# Patient Record
Sex: Male | Born: 1965 | Race: White | Hispanic: No | Marital: Single | Smoking: Former smoker
Health system: Southern US, Community
[De-identification: ages and names within clinical notes are randomized; demographics above are authoritative.]

## PROBLEM LIST (undated history)

## (undated) DIAGNOSIS — G4700 Insomnia, unspecified: Secondary | ICD-10-CM

## (undated) DIAGNOSIS — G4730 Sleep apnea, unspecified: Secondary | ICD-10-CM

## (undated) DIAGNOSIS — F319 Bipolar disorder, unspecified: Secondary | ICD-10-CM

## (undated) HISTORY — PX: CHOLECYSTECTOMY: SHX55

---

## 2007-09-17 ENCOUNTER — Inpatient Hospital Stay: Payer: Self-pay | Admitting: Internal Medicine

## 2007-09-28 ENCOUNTER — Emergency Department (HOSPITAL_COMMUNITY): Admission: EM | Admit: 2007-09-28 | Discharge: 2007-09-28 | Payer: Self-pay | Admitting: Emergency Medicine

## 2007-11-20 ENCOUNTER — Emergency Department: Payer: Self-pay | Admitting: Emergency Medicine

## 2007-11-20 ENCOUNTER — Other Ambulatory Visit: Payer: Self-pay

## 2008-02-11 ENCOUNTER — Inpatient Hospital Stay: Payer: Self-pay | Admitting: Unknown Physician Specialty

## 2009-01-28 ENCOUNTER — Inpatient Hospital Stay: Payer: Self-pay | Admitting: Internal Medicine

## 2009-01-31 ENCOUNTER — Inpatient Hospital Stay: Payer: Self-pay | Admitting: Psychiatry

## 2009-12-06 ENCOUNTER — Emergency Department: Payer: Self-pay | Admitting: Emergency Medicine

## 2010-01-14 ENCOUNTER — Ambulatory Visit: Payer: Self-pay | Admitting: Surgery

## 2010-01-21 ENCOUNTER — Ambulatory Visit: Payer: Self-pay | Admitting: Surgery

## 2010-03-03 ENCOUNTER — Inpatient Hospital Stay: Payer: Self-pay | Admitting: Psychiatry

## 2010-08-11 ENCOUNTER — Inpatient Hospital Stay: Payer: Self-pay | Admitting: Psychiatry

## 2011-09-02 LAB — RAPID URINE DRUG SCREEN, HOSP PERFORMED
Amphetamines: NOT DETECTED
Benzodiazepines: POSITIVE — AB
Cocaine: NOT DETECTED
Tetrahydrocannabinol: NOT DETECTED

## 2011-09-02 LAB — URINALYSIS, ROUTINE W REFLEX MICROSCOPIC
Glucose, UA: NEGATIVE
Hgb urine dipstick: NEGATIVE
Protein, ur: 30 — AB

## 2011-09-02 LAB — DIFFERENTIAL
Basophils Absolute: 0
Eosinophils Relative: 2
Lymphocytes Relative: 19
Lymphs Abs: 1.9
Monocytes Absolute: 0.6
Monocytes Relative: 6

## 2011-09-02 LAB — CBC
HCT: 47.9
Hemoglobin: 16.7
RBC: 5.46
RDW: 12.7

## 2011-09-02 LAB — BASIC METABOLIC PANEL
GFR calc Af Amer: >60
GFR calc non Af Amer: >60
Glucose, Bld: 95
Potassium: 3.6
Sodium: 142

## 2011-09-02 LAB — URINE MICROSCOPIC-ADD ON

## 2011-12-30 LAB — CBC
HCT: 46.4 % (ref 40.0–52.0)
MCH: 32.4 pg (ref 26.0–34.0)
MCHC: 34.4 g/dL (ref 32.0–36.0)
MCV: 94 fL (ref 80–100)
Platelet: 180 10*3/uL (ref 150–440)
WBC: 5.9 10*3/uL (ref 3.8–10.6)

## 2011-12-30 LAB — COMPREHENSIVE METABOLIC PANEL
Albumin: 3.6 g/dL (ref 3.4–5.0)
Anion Gap: 18 — ABNORMAL HIGH (ref 7–16)
Bilirubin,Total: 0.4 mg/dL (ref 0.2–1.0)
Calcium, Total: 8.8 mg/dL (ref 8.5–10.1)
Co2: 22 mmol/L (ref 21–32)
EGFR (Non-African Amer.): >60
Osmolality: 289 (ref 275–301)
Potassium: 2.8 mmol/L — ABNORMAL LOW (ref 3.5–5.1)
Sodium: 144 mmol/L (ref 136–145)

## 2011-12-31 ENCOUNTER — Inpatient Hospital Stay: Payer: Self-pay | Admitting: Internal Medicine

## 2011-12-31 LAB — DRUG SCREEN, URINE
Amphetamines, Ur Screen: NEGATIVE (ref ?–1000)
Cannabinoid 50 Ng, Ur ~~LOC~~: NEGATIVE (ref ?–50)
Cocaine Metabolite,Ur ~~LOC~~: NEGATIVE (ref ?–300)
MDMA (Ecstasy)Ur Screen: NEGATIVE (ref ?–500)

## 2011-12-31 LAB — URINALYSIS, COMPLETE
Bilirubin,UR: NEGATIVE
Blood: NEGATIVE
Glucose,UR: 50 mg/dL (ref 0–75)
Hyaline Cast: 20
Specific Gravity: 1.016 (ref 1.003–1.030)
Squamous Epithelial: <1

## 2011-12-31 LAB — PRO B NATRIURETIC PEPTIDE: B-Type Natriuretic Peptide: 72 pg/mL (ref 0–125)

## 2011-12-31 LAB — BASIC METABOLIC PANEL
Calcium, Total: 7.7 mg/dL — ABNORMAL LOW (ref 8.5–10.1)
Chloride: 112 mmol/L — ABNORMAL HIGH (ref 98–107)
Co2: 21 mmol/L (ref 21–32)

## 2011-12-31 LAB — SALICYLATE LEVEL: Salicylates, Serum: 2.1 mg/dL

## 2011-12-31 LAB — CK TOTAL AND CKMB (NOT AT ARMC): CK-MB: 0.9 ng/mL (ref 0.5–3.6)

## 2011-12-31 LAB — OSMOLALITY: Osmolality: 295 mOsm/kg (ref 275–295)

## 2012-01-01 LAB — BASIC METABOLIC PANEL
Calcium, Total: 7.7 mg/dL — ABNORMAL LOW (ref 8.5–10.1)
Chloride: 111 mmol/L — ABNORMAL HIGH (ref 98–107)
Co2: 22 mmol/L (ref 21–32)
EGFR (African American): >60
Potassium: 3.4 mmol/L — ABNORMAL LOW (ref 3.5–5.1)
Sodium: 144 mmol/L (ref 136–145)

## 2012-01-01 LAB — CBC WITH DIFFERENTIAL/PLATELET
Basophil %: 0.1 %
Eosinophil #: 0 10*3/uL (ref 0.0–0.7)
HCT: 37.9 % — ABNORMAL LOW (ref 40.0–52.0)
HGB: 13 g/dL (ref 13.0–18.0)
Lymphocyte %: 7.5 %
MCH: 32.2 pg (ref 26.0–34.0)
MCV: 94 fL (ref 80–100)
Neutrophil #: 10.2 10*3/uL — ABNORMAL HIGH (ref 1.4–6.5)
RBC: 4.05 10*6/uL — ABNORMAL LOW (ref 4.40–5.90)
WBC: 11.5 10*3/uL — ABNORMAL HIGH (ref 3.8–10.6)

## 2012-01-01 LAB — HEPATIC FUNCTION PANEL A (ARMC)
SGOT(AST): 16 U/L (ref 15–37)
SGPT (ALT): 11 U/L — ABNORMAL LOW
Total Protein: 4.8 g/dL — ABNORMAL LOW (ref 6.4–8.2)

## 2012-01-02 LAB — BASIC METABOLIC PANEL
BUN: 8 mg/dL (ref 7–18)
EGFR (African American): >60
EGFR (Non-African Amer.): >60
Glucose: 128 mg/dL — ABNORMAL HIGH (ref 65–99)
Osmolality: 289 (ref 275–301)
Potassium: 3.5 mmol/L (ref 3.5–5.1)
Sodium: 145 mmol/L (ref 136–145)

## 2012-01-02 LAB — CBC WITH DIFFERENTIAL/PLATELET
Basophil #: 0 10*3/uL (ref 0.0–0.1)
HCT: 37.1 % — ABNORMAL LOW (ref 40.0–52.0)
Lymphocyte %: 9.2 %
Monocyte %: 4 %
Platelet: 117 10*3/uL — ABNORMAL LOW (ref 150–440)
RDW: 14.3 % (ref 11.5–14.5)
WBC: 9.8 10*3/uL (ref 3.8–10.6)

## 2012-01-03 LAB — PHOSPHORUS: Phosphorus: 2.9 mg/dL (ref 2.5–4.9)

## 2012-01-03 LAB — CBC WITH DIFFERENTIAL/PLATELET
Basophil %: 0.3 %
Eosinophil %: 2.7 %
HGB: 12.8 g/dL — ABNORMAL LOW (ref 13.0–18.0)
Lymphocyte #: 0.9 10*3/uL — ABNORMAL LOW (ref 1.0–3.6)
MCH: 32.3 pg (ref 26.0–34.0)
MCV: 94 fL (ref 80–100)
Monocyte %: 5.1 %
Neutrophil #: 7.2 10*3/uL — ABNORMAL HIGH (ref 1.4–6.5)
RDW: 14.2 % (ref 11.5–14.5)

## 2012-01-03 LAB — BASIC METABOLIC PANEL
Anion Gap: 10 (ref 7–16)
Calcium, Total: 8.3 mg/dL — ABNORMAL LOW (ref 8.5–10.1)
Co2: 27 mmol/L (ref 21–32)
Creatinine: 0.76 mg/dL (ref 0.60–1.30)
EGFR (African American): >60

## 2012-01-03 LAB — MAGNESIUM: Magnesium: 1.9 mg/dL

## 2012-01-05 ENCOUNTER — Inpatient Hospital Stay: Payer: Self-pay | Admitting: Psychiatry

## 2012-01-05 LAB — BASIC METABOLIC PANEL
Calcium, Total: 9.1 mg/dL (ref 8.5–10.1)
Chloride: 106 mmol/L (ref 98–107)
Co2: 28 mmol/L (ref 21–32)
Glucose: 93 mg/dL (ref 65–99)
Osmolality: 287 (ref 275–301)
Potassium: 3.4 mmol/L — ABNORMAL LOW (ref 3.5–5.1)

## 2012-01-05 LAB — CBC WITH DIFFERENTIAL/PLATELET
Basophil #: 0 10*3/uL (ref 0.0–0.1)
Eosinophil #: 0.3 10*3/uL (ref 0.0–0.7)
Lymphocyte %: 17.3 %
MCH: 32.3 pg (ref 26.0–34.0)
MCHC: 34.4 g/dL (ref 32.0–36.0)
Monocyte #: 0.8 10*3/uL — ABNORMAL HIGH (ref 0.0–0.7)
Neutrophil %: 67.9 %
Platelet: 150 10*3/uL (ref 150–440)
RDW: 14 % (ref 11.5–14.5)

## 2012-01-05 LAB — FOLATE: Folic Acid: 10.5 ng/mL (ref 3.1–100.0)

## 2012-01-06 LAB — BRONCHIAL WASH CULTURE

## 2012-01-06 LAB — EXPECTORATED SPUTUM ASSESSMENT W GRAM STAIN, RFLX TO RESP C

## 2012-01-07 LAB — FOLATE: Folic Acid: 15.3 ng/mL (ref 3.1–100.0)

## 2012-01-07 LAB — LIPID PANEL
Cholesterol: 103 mg/dL (ref 0–200)
Ldl Cholesterol, Calc: 60 mg/dL (ref 0–100)
Triglycerides: 112 mg/dL (ref 0–200)
VLDL Cholesterol, Calc: 22 mg/dL (ref 5–40)

## 2013-03-19 IMAGING — CT CT HEAD WITHOUT CONTRAST
2 series · 16 of 30 positions shown, 20 images · non-contrast
Comparison: none

REASON FOR EXAM: found minimally responsive beside the road
COMMENTS:

PROCEDURE:     CT  - CT HEAD WITHOUT CONTRAST  - December 30, 2011 [DATE]
RESULT:     Comparison:  None
TECHNIQUE: Multiple axial images from the foramen magnum to the vertex were
obtained without IV contrast.

[Series 2: without · axial · non-contrast · 0.47mm/px · z∈[+443,+593]mm · 13 of 41 slices shown, 17 images]
[im 3/41  brain]
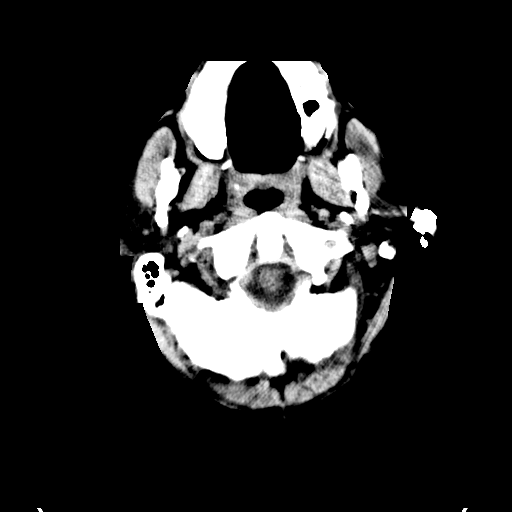
[im 3/41  bone]
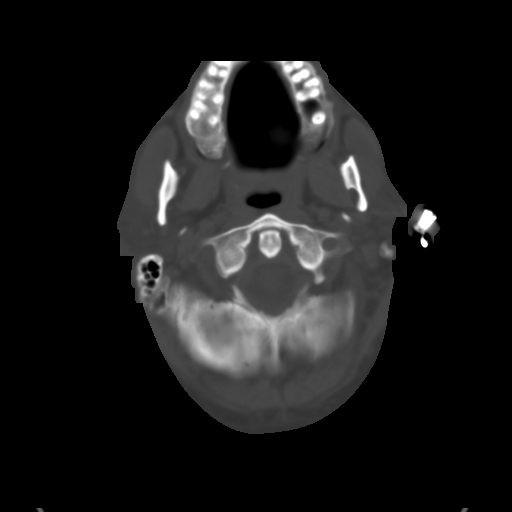
[im 6/41  brain]
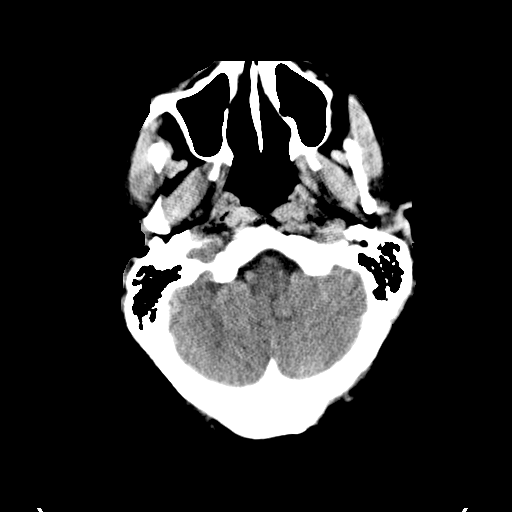
[im 9/41  brain]
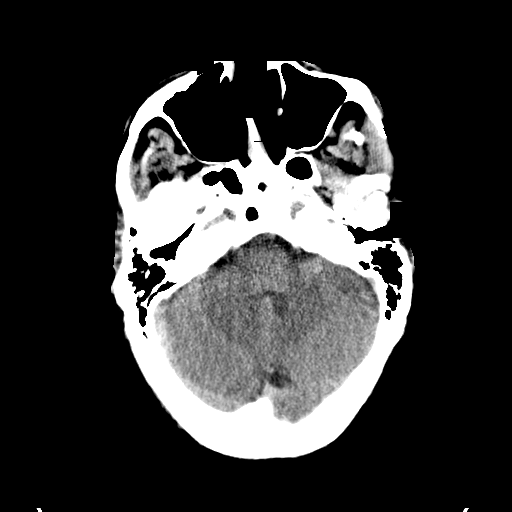
[im 12/41  brain]
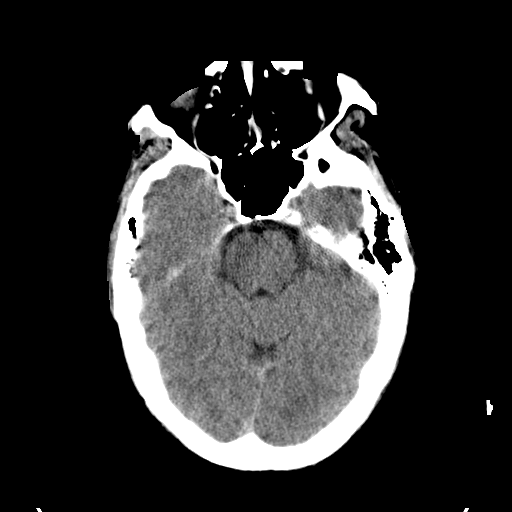
[im 15/41  brain]
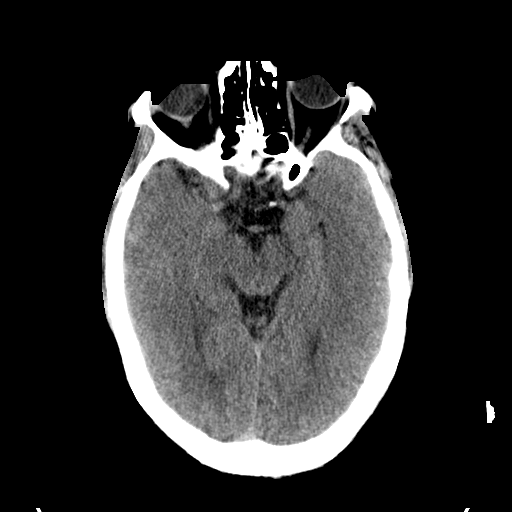
[im 15/41  bone]
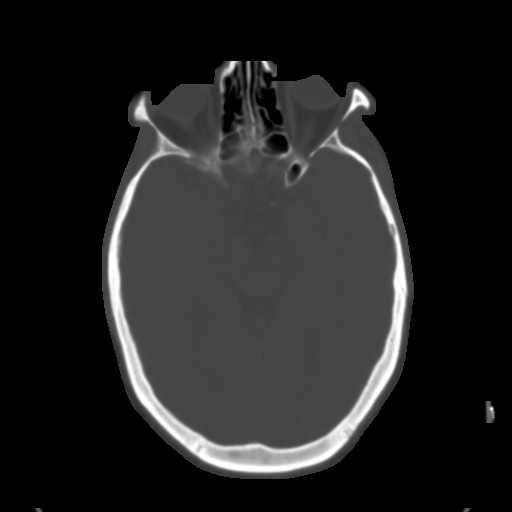
[im 18/41  brain]
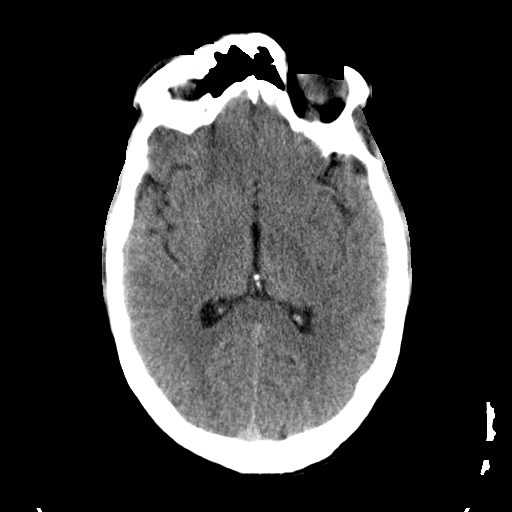
[im 21/41  brain]
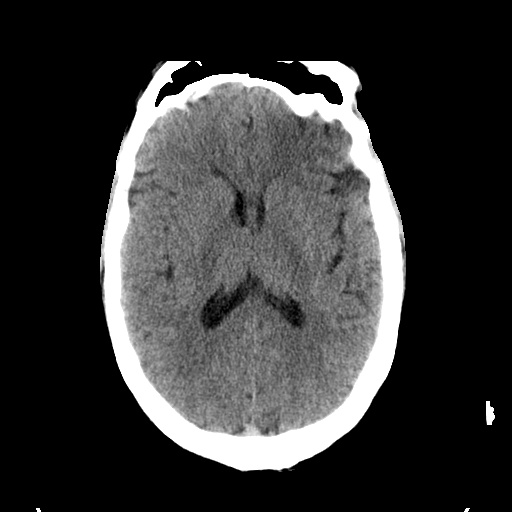
[im 23/41  brain]
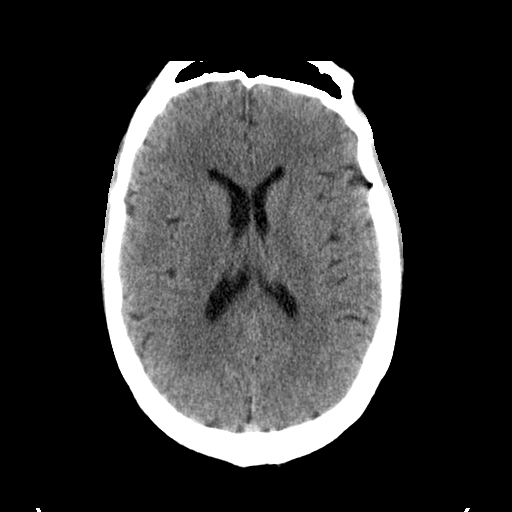
[im 26/41  brain]
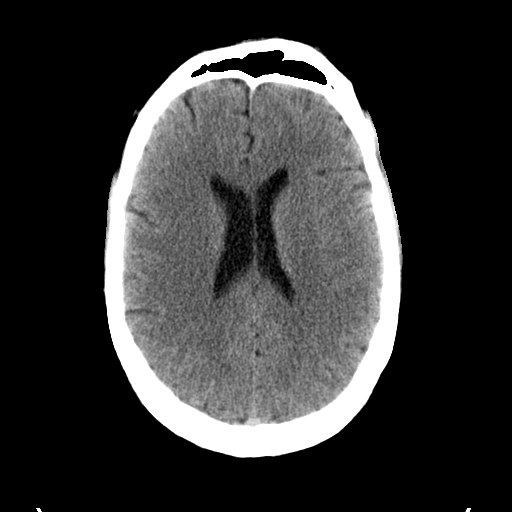
[im 26/41  bone]
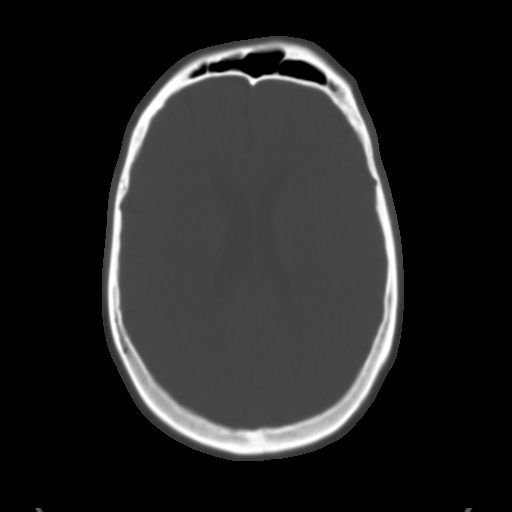
[im 29/41  brain]
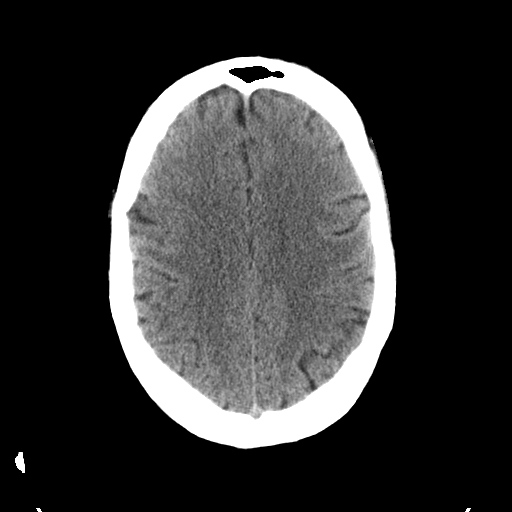
[im 32/41  brain]
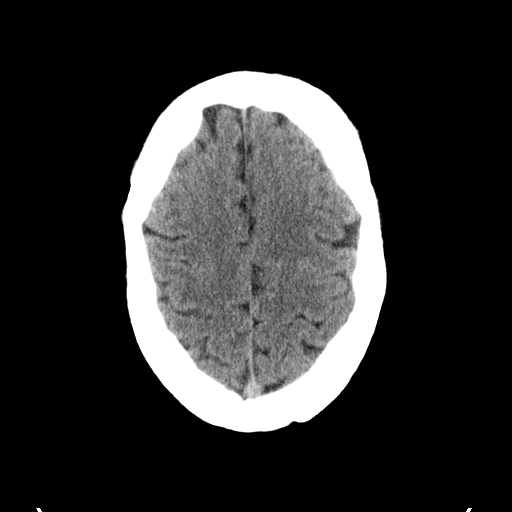
[im 35/41  brain]
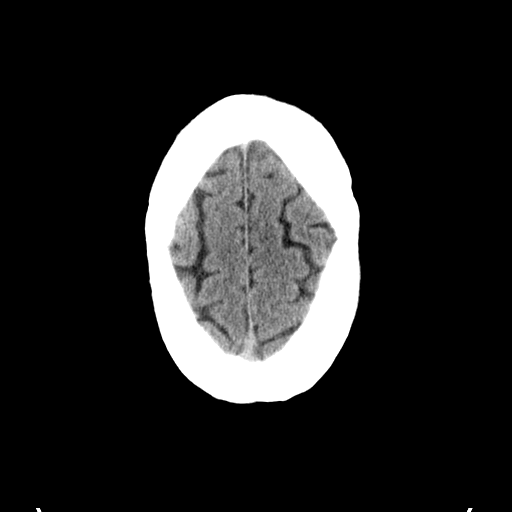
[im 38/41  brain]
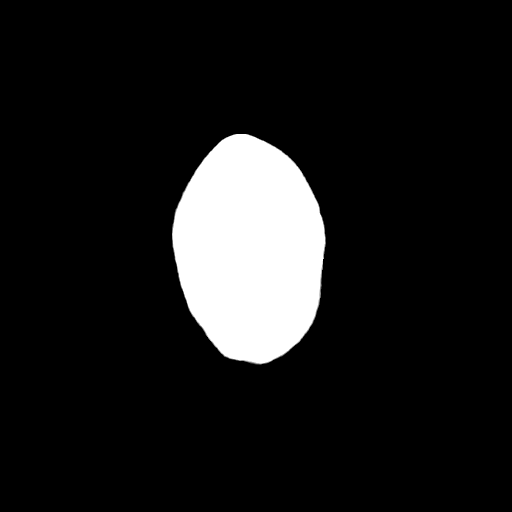
[im 38/41  bone]
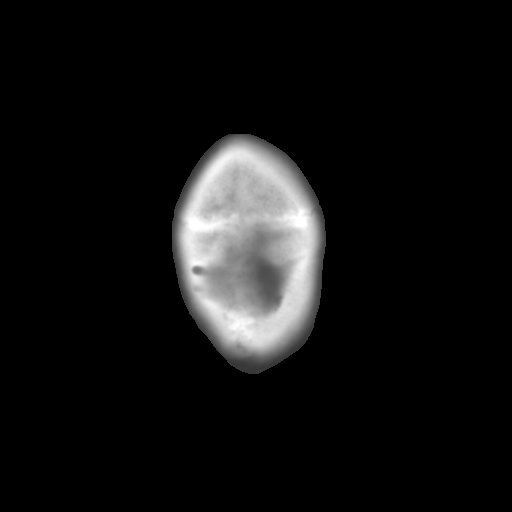

[Series 3: bone · axial · 0.47mm/px · z∈[+443,+503]mm · 3 of 41 slices shown]
[im 3/41  bone]
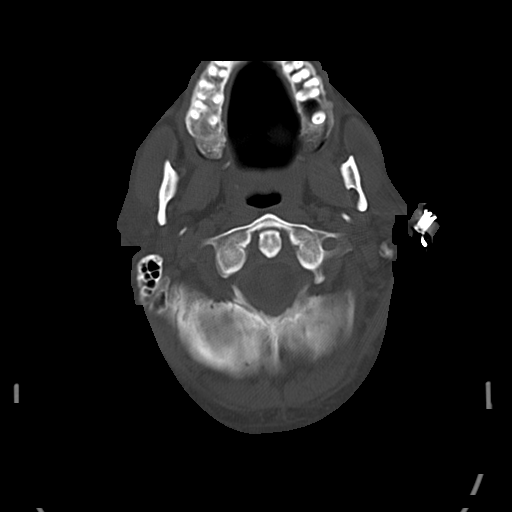
[im 9/41  bone]
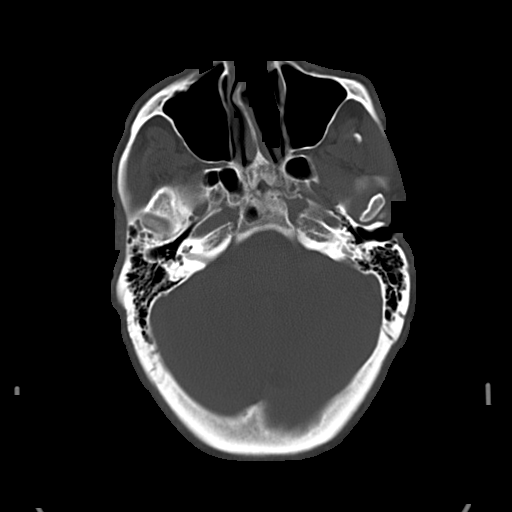
[im 15/41  bone]
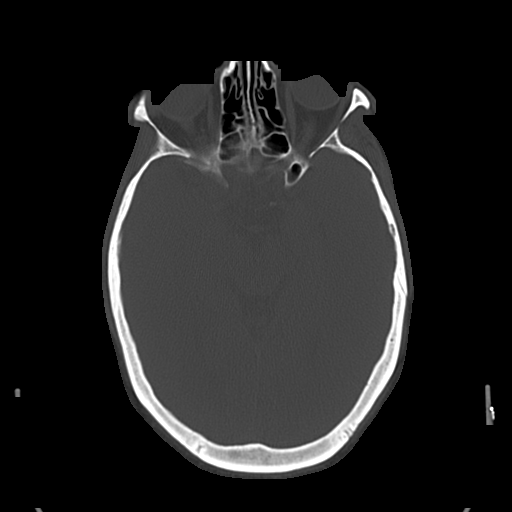

[16 of 30 positions shown; findings below may reference images not displayed]

FINDINGS: There is no evidence of mass effect, midline shift, or extra-axial fluid
collections.  There is no evidence of a space-occupying lesion or
intracranial hemorrhage. There is no evidence of a cortical-based area of
acute infarction.

The ventricles and sulci are appropriate for the patient's age. The basal
cisterns are patent.

Visualized portions of the orbits are unremarkable. The visualized portions
of the paranasal sinuses and mastoid air cells are unremarkable.

The osseous structures are unremarkable.
IMPRESSION: No acute intracranial process.

## 2014-06-03 DIAGNOSIS — F200 Paranoid schizophrenia: Secondary | ICD-10-CM | POA: Insufficient documentation

## 2015-03-18 NOTE — Discharge Summary (Signed)
PATIENT NAME:  Randy Velez, Randy Velez DATE OF BIRTH:  Feb 28, 1966  DATE OF ADMISSION:  01/05/2012 DATE OF DISCHARGE:  01/12/2012  HISTORY OF PRESENT ILLNESS: [name] is a 49 year old male with bipolar disorder and alcohol dependence who was admitted to the inpatient behavioral unit on 01/05/2012, coming off the general medical ward. He was on the general medical ward after overdosing on 25 of 300-mg tablets of Seroquel.  He initially required Critical Care Unit care due to respiratory distress. He also suffered from aspiration pneumonia.  His alcohol level in the Emergency Room was 119. He acknowledged suicidal intent regarding his overdose.   Precipitating stress had involved an argument with his boyfriend. There was concern that his boyfriend had been unfaithful. Please see the History and Physical dictation.   ANCILLARY CLINICAL DATA: The patient did receive Levaquin 500 mg daily for five days regarding the ongoing treatment of his aspiration pneumonia once he got to the inpatient behavioral health unit. His Protonix was continued for gastroesophageal reflux disease.   HOSPITAL COURSE: [name] was admitted to the inpatient behavioral unit and underwent the Ativan CIWA protocol. He was restarted on his Seroquel for anti-psychosis and mood stabilization. The Seroquel was titrated to 800 mg, the previous effective dosage.  By 01/09/2012  he no longer had suicidal ideation; however, his affect was still labile.   By 02/17 he was continuing to improve with brighter affect and stabilizing mood. He was tolerating his Prozac and Seroquel well.  CONDITION ON DISCHARGE: By 01/12/2012 [name] was not having any suicidal or homicidal ideation. He was showing normal mood. His thought process was logical, goal-directed, with no looseness of associations.   MENTAL STATUS EXAM UPON DISCHARGE: [name] is alert and oriented. He is cooperative. He has normal dress and grooming. His speech is clear,  coherent, with normal rate and volume. He has a "fine" mood. Thought process is logical, coherent, and goal-directed. No looseness of associations. He has no suicidal ideation or homicidal ideation. He is able to contract for safety on an outpatient basis. He has no auditory or visual hallucinations. His memory is intact. He has intact insight and judgment   DISCHARGE DIAGNOSES:  AXIS I:  Bipolar disorder, recent episode depressed, now stable. Alcohol dependence.   AXIS II: Deferred.   AXIS III:  None.  AXIS IV: Economic, primary support group.   AXIS V: 45.   DISCHARGE MEDICATIONS:  1. Prozac 20 mg daily.  2. Seroquel 800 mg at bedtime.   DIET: Regular.   ACTIVITY: Routine.   FOLLOWUP:  1. Follow up with [facility] 02/19 at 4:00 p.m.  2. Follow up with [facility] at 11:00 a.m. on 02/21. 3. Follow up with Dr. Suzie PortelaMoffitt of [facility] at 2 p.m. on 04/30. 4. 12-step meetings daily.   ____________________________ Adelene AmasJames S. Blayke Cordrey, MD jsw:bjt D: 01/22/2012 21:14:11 ET T: 01/23/2012 10:51:24 ET JOB#: [id number]  cc: Adelene AmasJames S. Sotiria Keast, MD, <Dictator> Lester CarolinaJAMES S Mariusz Jubb MD ELECTRONICALLY SIGNED 01/23/2012 13:26

## 2015-03-18 NOTE — Consult Note (Signed)
PATIENT NAME:  Randy Velez MR#:  [id number] DATE OF BIRTH:  1966-01-01  DATE OF CONSULTATION:  01/04/2012  REFERRING PHYSICIAN:  Darrick Meigs, MD CONSULTING PHYSICIAN:  Doralee Albino. Maryruth Bun, MD  REASON FOR CONSULTATION: Status post overdose on Seroquel.   IDENTIFYING INFORMATION: Randy Velez is a 49 year old single Caucasian male with a prior diagnosis of bipolar disorder, alcohol dependence, and borderline personality disorder, currently homeless and unemployed. He is on Disability for the past three years. He has never been married and has no friends.   HISTORY OF PRESENT ILLNESS: Randy Velez is a 49 year old single Caucasian male with a prior diagnosis of a mood disorder, alcohol dependence-now in partial remission, as well as borderline personality disorder and multiple prior inpatient psychiatric hospitalizations at [facility] who was admitted to the Medicine Service after having overdosed on Seroquel, approximately 25 pills of 300 mg each. The patient was initially admitted to the Critical Care Unit and intubated secondary to respiratory distress. His hospital course has been complicated as well by aspiration pneumonia. Toxicology screen in the Emergency Room was positive for TCAs but negative for other substances, and ethanol level was 118. The patient denies having drank alcohol on a daily basis but says that he drank when he planned to take the pills. He does admit to suicidal intent. The patient says that he and his boyfriend of three months got into an argument, and the patient suspects that his boyfriend has been cheating on him. He said that he [place] walking around in the downtown area and then took the pills. The patient remains worried and preoccupied with what his boyfriend thinks about him. He has a history of having overdosed on Seroquel in the past as well as multiple other suicide attempts, including having drank antifreeze. The patient says that he wanted to leave his boyfriend because he  could not handle the fighting but does not want to break up with him. He says he drank when he took the pills but denies any daily alcohol use or illicit drug use. He does admit to feeling more anxious than depressed but does report problems with frequent crying spells, feelings of hopelessness and decreased energy level. He denies any difficulty with appetite. He has not been sleeping very well even though he does take the Seroquel 600 mg at bedtime. He denies any current active suicidal thoughts but does admit to some intermittent passive suicidal thoughts. He says that when he took the overdose it was impulsive, and he denied planning the suicide attempt and said that he was not having suicidal thoughts prior to the argument with his boyfriend. The patient denies any paranoid thoughts or delusions. He denies any current auditory or visual hallucinations. He does have a history of mood instability, including irritability and some anger outbursts, but denies any grandiose delusions, decreased sleep for several days at a time with increased goal-directed behavior, hyperreligious thoughts, or hypersexual behavior.   The patient was intubated in the Emergency Room and transferred to the Critical Care Unit. He has since been extubated and moved out of the Critical Care Unit to the regular medical floor. Chest x-ray did show bibasilar densities, and the patient is currently on antibiotics for aspiration pneumonia. He is fully oriented and is not experiencing any signs of delirium.   PAST PSYCHIATRIC HISTORY: The patient has had a number of prior inpatient psychiatric hospitalizations beginning at the age of 66. He does have a history of being extremely manipulative during prior hospitalizations. He has been  hospitalized at [facility], [facility], [facility], [facility] and [facility]. He was followed by Dr. Janeece Riggers at [facility], but after Dr. Janeece Riggers left to go to [facility] the patient is supposed to be seeing  Dr. Suzie Portela at [facility]. He has his first appointment with him in March. He is currently taking Seroquel XR 600 mg at bedtime and denies any other psychotropic medications. He has failed multiple other psychotropic medication trials including Remeron, Haldol, Geodon, Risperdal, lithium, Depakote, Celexa and trazodone. He does have a history of multiple suicide attempts by overdose as well as drinking antifreeze. He denies any history of cutting.   FAMILY PSYCHIATRIC HISTORY: The patient denies any history of any mental illness or substance use in the family.   PRIMARY CARE PHYSICIAN: Dr. Lacie Scotts.   PAST MEDICAL HISTORY: History of cholecystectomy in February 2001. Recent overdose on Seroquel. He denies any history of any TBI or seizures.   CURRENT OUTPATIENT MEDICATION: Seroquel XR 600 mg at bedtime.   ALLERGIES: Celexa, Depakote, lithium, morphine and trazodone.   SUBSTANCE ABUSE HISTORY: The patient does have a history of alcohol dependence reports 6 to 7 years of sobriety. He says he drank prior to taking the pills because he wanted to drink if he was going to kill himself. He does have a history of shakes and tremors but no alcohol withdrawal seizures. He also has a history of four DUIs in the past. He denies any cocaine, cannabis, opiate, or stimulant use. He quit smoking 8 to 10 years ago.   SOCIAL HISTORY: The patient was born and raised in the [place] area primarily by his mother and grandparents. His father was killed in a motor vehicle accident in 70 when the patient was 46 years old. Mother still lives in the [place] area, and the patient had lived with his mother in the past but has not been with her for the past two years. He has been homeless for the past one month staying with friends and intermittently a boarding house. Prior to that he was renting a room. He says he uses his Disability check to help pay other people's bills for them, so he does not have enough money for his own  living situation. The patient has an eleventh grade education from [employer], and his longest job was in a [employer] for about 6 to 7 years. He has been on Disability since 2009. He has never been married and has no children. He denies any history of any physical or sexual abuse.   LEGAL HISTORY: The patient does have a history of 5 or 6 arrests in the past, 4 for DUIs and 1 for breaking and entering. He has been incarcerated for a one-year period in the past. He denied any current pending charges.   MENTAL STATUS EXAM: Mr. Rosevear is a 49 year old Caucasian male who is lying in his hospital bed in a hospital gown. He was fully alert and oriented to time, place, and situation. Speech was slow and soft but fluent and coherent, regular rate and rhythm. He was answering questions appropriately, and thought processes were logical and goal directed. Eye contact was fairly good. He did not appear to be agitated. Mood was depressed and affect was congruent. He did endorse some intermittent passive suicidal thoughts but no active suicidal thoughts. He denied any current hallucinations including auditory or visual hallucinations. No paranoid thoughts or delusions. Judgment and insight appeared to be fairly good. Attention and concentration were fairly  good. He could name the Presidents backwards to [place] and do serial sevens to 86. He did have difficulty spelling world backwards. Abstraction was concrete.  SUICIDE RISK ASSESSMENT: At this time, Mr. Mcquerry remains at a moderately elevated risk of harm to self and others secondary to recent suicide attempt as well as lack of primary support and other psychosocial problems, including homelessness and financial problems. He denies having any access to guns. The patient was able to contract for safety inside the hospital.   REVIEW OF SYSTEMS: CONSTITUTIONAL: The patient denies any weakness, fatigue or weight changes. HEENT: He does complain of a headache but  no dizziness. EYES: He denies any diplopia or blurred vision. EARS: He denies any difficulty hearing. NECK: He denies any neck pain. RESPIRATORY: He does complain of some shortness of breath and cough. CARDIOVASCULAR: He denies any chest pain or orthopnea. He denies any syncopal episodes. GASTROINTESTINAL: He denies any nausea, vomiting, or abdominal pain. He denies any change in bowel movements. He denies any dysuria. GENITOURINARY: He denies incontinence or problems with frequency of urine. LYMPHATIC: He denies any anemia or easy bruising. ENDOCRINE: He denies any heat or cold intolerance. MUSCULOSKELETAL: He does complain that his legs feel weak. He denies any other muscle aches or joint pain. NEUROLOGIC: He denies any tingling or weakness. PSYCHIATRIC: Please see the history of present illness.   PHYSICAL EXAMINATION: VITAL SIGNS: Blood pressure 107/68, heart rate 81, respirations 20, temperature 98. Please see initial physical exam as completed by admitting physician, Dr. Dava Najjar.   LABORATORY, DIAGNOSTIC AND RADIOLOGICAL DATA:  Toxicology screen was positive for TCAs but negative for all other substances.  Ethanol level was 118 when he came to the Emergency Room. Sodium 146, potassium 3.6, chloride 109, CO2 27, BUN 5, creatinine 0.76, glucose 131.  White blood cell count 8.8, hemoglobin 12.8, platelet count 131.  Arterial blood gas: pH was 7.42, pO2 78, FiO2 40.  EKG showed a ventricular rate of 76 with a QTc interval of 463 on February 7th.  Chest x-ray on 01/03/2012 showed mild interval improvement in the appearance of the pulmonary interstitium, left lower lobe atelectasis and/or infiltrate.   DIAGNOSES:  AXIS I:  1. Bipolar disorder.  2. Alcohol dependence, in partial remission.   AXIS II: Borderline personality disorder.   AXIS III:  1. Recent overdose on Seroquel.  2. History of cholecystectomy in February 2011.   AXIS IV: Severe. Unemployed and on Disability, homelessness,  financial problems, lack of primary support.   AXIS V: Global Assessment of Functioning score at present equals 25.   ASSESSMENT AND TREATMENT RECOMMENDATIONS: Mr. Stetson is a 49 year old Caucasian male with a history of bipolar disorder as well as alcohol dependence, now in partial remission, who presented to the Emergency Room after overdosing on Seroquel in a suicide attempt. He took approximately 25 pills of 300 mg of Seroquel after a verbal altercation with his boyfriend. The patient reports that the event was impulsive but does admit to some intermittent passive suicidal thoughts. No current psychotic symptoms.  1. Bipolar disorder, most recent episode depressed, without psychotic features: The patient will be restarted back on Seroquel XR initially at 200 mg at bedtime with a plan to titrate back up as tolerated. The patient is not wanting to try other psychotropic medications at this time, and due to history of multiple allergies as well as failures of other psychotropic medications we will plan to restart Seroquel. We will check a lipid panel in the a.m.  as well as B12 and folate. We will also consider starting an antidepressant, Prozac 20 mg p.o. daily, which the patient had been during his last hospitalization but has been noncompliant with and did not restart as an outpatient. Please keep on suicide precautions and a one-to-one sitter while on the Medical floor until transferred to Inpatient Psychiatry.  2. History of alcohol dependence, in partial remission: The patient had initially stated that he was not drinking alcohol on a daily basis and only drank in the context of the overdose. He has been placed on Ativan per CIWA when he initially came to the Emergency Room as he was not able to provide any history at that time. CIWA scores have been low, and he is not experiencing any alcohol withdrawal symptoms. We will plan to discontinue CIWA.  3. Aspiration pneumonia: The patient is currently on  Zithromax , Zosyn and vancomycin. We will continue all antibiotics per Medicine.  4. Gastroesophageal reflux disease: We will continue Protonix.  5. Disposition: We will plan to transfer to Inpatient Psychiatry once medically cleared by Hospitalist Service. Please keep under involuntary commitment at this time.   TIME SPENT: 80 minutes ____________________________ Doralee AlbinoAarti K. Maryruth BunKapur, MD akk:cbb D: 01/04/2012 11:40:01 ET T: 01/04/2012 12:04:53 ET JOB#: [id number]  cc: Umaima Scholten K. Maryruth BunKapur, MD, <Dictator> Darliss RidgelAARTI K Precious Gilchrest MD ELECTRONICALLY SIGNED 01/04/2012 14:13

## 2015-03-18 NOTE — H&P (Signed)
PATIENT NAME:  Randy Velez, MCCLEOD MR#:  [id number] DATE OF BIRTH:  13-Apr-1966  DATE OF ADMISSION:  12/30/2011  ER REFERRING PHYSICIAN: Lowella Fairy, MD    PRIMARY CARE PHYSICIAN: None  CHIEF COMPLAINT: Unresponsiveness.   HISTORY OF PRESENT ILLNESS: The patient is a 49 year old male with a past medical history of depression, bipolar disorder, alcohol abuse, with multiple suicide attempts in the past with overdose of Seroquel and also antifreeze/ethylene glycol. The patient was found unresponsive The patient was brought in by EMS after being found unresponsive. In the ED, the patient was essentially unresponsive. He woke up once to be able to tell that he took an overdose of Seroquel, which he has done in the past. In the Emergency Department, he was intubated for inability to protect airway. He was also hypotensive and hypoxic. When the ED physician intubated the patient, he noted a lot of purulent secretions on intubation. His chest x-ray also shows bibasilar densities consistent with possible aspiration pneumonia. The patient also has signs of sepsis with hypoxia, hypotension and acidosis. The patient was evaluated by Dr. Maryruth Bun, the psychiatrist in the ED, and was placed on Ativan per CIWA protocol, had a 1 to 1 sitter and suicide precautions. Additional management could not be done since the patient was unresponsive.   ALLERGIES: Celexa, Depakote, lithium, morphine, trazodone.   MEDICATIONS: It is not known what medications the patient was on. As per the Prescription Writer, he was supposed to be on: 1. Doxepin 25 mg daily. 2. Prozac 20 mg daily. 3. Seroquel XR 200 mg, 3 tablets at bedtime.   PAST MEDICAL HISTORY:  1. History of depression/bipolar disorder.  2. Alcohol abuse.  3. Multiple suicide attempts in the past with drug overdose, including Seroquel and drinking antifreeze/ethylene glycol.   PAST SURGICAL HISTORY: As per medical records, the patient had a laparoscopic cholecystectomy.    FAMILY HISTORY: As per old medical records, it is positive for depression.   SOCIAL HISTORY: As per old medical records, the patient has a history of alcohol use. No history of drug abuse or smoking. He lived with his mother.  REVIEW OF SYSTEMS: Currently, the patient is intubated and sedated on Versed. He is unable to provide a review of systems.   PHYSICAL EXAMINATION:  VITAL SIGNS: Temperature 97.1, heart rate 100, respiratory rate 16, blood pressure 90/61, pulse oximetry 95%.   GENERAL: The patient is a 49 year old Caucasian male who is critically ill.   HEENT: Head:  Atraumatic, normocephalic. Eyes: There is mild pallor. No icterus or cyanosis. Pupils are normal in size and react to light normally. ENT: The patient is orally intubated. His oral mucosa is wet. Poor dental hygiene   NECK: Supple. No masses. No JVD. No thyromegaly or lymphadenopathy.   CHEST WALL: Not using accessory muscles of respiration. No intercostal retractions.   LUNGS: Bibasilar crepitations. No wheezing or rhonchi.   CARDIOVASCULAR: S1, S2 regular. No murmur, rubs, or gallops.   ABDOMEN: Soft with no distention, no evidence of organomegaly. Hypoactive bowel sounds.   SKIN: No rashes or lesions.   PERIPHERIES: No pedal edema, 1+ pedal pulses.   MUSCULOSKELETAL: No cyanosis or clubbing.   NEUROLOGICAL: The patient is currently intubated and sedated on Versed, unable to evaluate.   PSYCHIATRIC: Unable to evaluate since the patient is intubated and sedated.   LABORATORY, DIAGNOSTIC AND RADIOLOGICAL DATA:  Arterial blood gas: pH 7.32, pCO2 33, pO2 51, FiO2 100, bicarbonate 17.  Chest x-ray shows bibasilar infiltrates compatible with pneumonia.  more prominent on the left. Urinalysis shows no evidence of infection.  Urine drug screen is positive for tricyclic antidepressants.  CAT scan of the head shows no acute intracranial abnormality.   CBC normal.  Glucose 188, BUN 5, creatinine 0.64, sodium 144,  potassium 2.8.  LFTs are normal.  Alcohol level is 0.118. TSH 1.34. Salicylate level 2.1, within normal range. Tylenol level less than 2.0.   ASSESSMENT AND PLAN: A 49 year old male with past medical history of depression, bipolar disorder, multiple suicide attempts, brought in by EMS after being found unresponsive, possibly took an overdose of Seroquel: Intubated in the ED for inability to protect airway, hypotension and hypoxia.   1. Acute respiratory failure: We will admit the patient to the Intensive Care Unit, start on ventilator protocol. We will get a Pulmonary consult and start the patient on Combivent and Flovent. We will try and wean off the ventilator as able.  2. Aspiration pneumonia: During intubation, excessive amounts of purulent secretions were suctioned out. Chest x-ray does show bibasilar densities, more prominent on the left. We will obtain blood cultures and sputum cultures and start on broad-spectrum antibiotics to cover for anaerobic bacteria.  3. Sepsis with hypoxia, hypotension and acidosis: Likely due to above. We will resuscitate with fluids. We will start on vasopressors, if needed/if the MAP is less than 16.  4. Hypokalemia: We will replace IV and check a magnesium level.  5. Depression, suicide attempt, alcohol abuse: The patient is currently under involuntary commitment.  The patient has been seen by the psychiatrist in the ED. We will reconsult when the patient is extubated and able to provide a history. 6. Hyperglycemia, possibly reactive: We will place on insulin sliding scale and check Accu-Cheks every 6 hours.  7. Alcohol abuse: The patient's alcohol level was 0.118. He is currently on mechanical ventilation and sedation. We will continue to monitor for alcohol withdrawal after extubation.  8. History of suicide attempt by drinking antifreeze: We will check ethylene glycol and serum osmolality level.  9. We will place on GI and deep venous thrombosis  prophylaxis.    I discussed with the ED physician, reviewed old medical records.   CRITICAL CARE TIME SPENT: 50 minutes.   ____________________________ Darrick MeigsSangeeta Myia Bergh, MD sp:cbb D: 12/31/2011 16:34:36 ET T: 12/31/2011 16:57:15 ET JOB#: [id number]  cc: Darrick MeigsSangeeta Tyshawna Alarid, MD, <Dictator> Darrick MeigsSANGEETA Jennife Zaucha MD ELECTRONICALLY SIGNED 12/31/2011 20:58

## 2015-03-18 NOTE — Discharge Summary (Signed)
PATIENT NAME:  Randy Velez, Randy Velez DATE OF BIRTH:  [birth year]  DATE OF ADMISSION:  12/31/2011 DATE OF DISCHARGE:  01/05/2012  DISCHARGE DIAGNOSES:  1. Acute hypoxic respiratory failure due to overdose and aspiration pneumonia.  2. Bipolar disorder, depression. 3. Alcohol abuse. 4. Seroquel overdose.  5. Hypotension.  6. Hypokalemia.  7. Anemia. 8. Thrombocytopenia.   DISPOSITION: Patient is being discharged to Behavioral Medicine.   DIET: Regular.   ACTIVITY: As tolerated.   DISCHARGE MEDICATIONS:  1. Seroquel SR 400 mg at bedtime. 2. Levaquin 500 mg daily for five day. 3. Protonix 40 mg daily.   CONSULTATIONS:  1. Psychiatry consultation with Dr. Maryruth BunKapur.  2. Pulmonary consultation with Dr. Belia HemanKasa.   LABORATORY, DIAGNOSTIC AND RADIOLOGICAL DATA: Patient had a bronchoscopy with BAL. His blood, urine and BAL cultures have been negative so far. Chest x-ray showed possible aspiration pneumonitis. White count 11.5 on admission, 7.2 by the time of discharge. Normal hemoglobin and platelet count. Mild hypokalemia which have been supplemented. Rest of complete metabolic panel was normal.   HOSPITAL COURSE: patient is a 49 year old male with past medical history of depression, multiple suicide attempts in the past due to Seroquel overdose was brought in by EMS after he was found unresponsive on 12/30/2011. Patient was intubated in the Emergency Room for inability to protect airway, hypotension, hypoxia. patient was admitted to the Intensive Care Unit and was on mechanical ventilation till 01/03/2012. patient was given spontaneous rate breathing time and he was extubated. He was gradually weaned off the oxygen and is currently on room air. There was a possibility of aspiration pneumonia. patient was treated with broad-spectrum antibiotics. He had a bronchoscopy done on 02/07. BAL showed scant growth of normal flora. Initially patient was also hypotensive, likely shock with sepsis from  aspiration pneumonia which is currently resolved. patient is currently normotensive. Once patient was extubated he passed a swallow evaluation and was tolerating oral diet. He has been switched to oral antibiotics. He had mild electrolyte abnormality, anemia and thrombocytopenia which have resolved. Patient was under involuntary commitment. A psychiatric consultation with Dr. Maryruth BunKapur was obtained who recommended ongoing treatment as an inpatient. Patient is being discharged to Behavioral Medicine in stable condition.   TIME SPENT: 45 minutes.   ____________________________ Darrick MeigsSangeeta Marianne Golightly, MD sp:cms D: 01/05/2012 17:07:59 ET T: 01/06/2012 10:47:01 ET JOB#: [id number]  cc: Darrick MeigsSangeeta Zoii Florer, MD, <Dictator> Darrick MeigsSANGEETA Rockell Faulks MD ELECTRONICALLY SIGNED 01/07/2012 8:33

## 2015-03-18 NOTE — Consult Note (Signed)
Brief Consult Note: Diagnosis: Mood Disorder, NOS, Alcohol Dependence.   Patient was seen by consultant.   Comments: [name] is a 49 y/o Caucasian male brought to the ER via EMS secondary to being unresponsive in the context of alcohol intoxication. He took an unknown quantity of Seroquel in a possible suicide attempt. Patient was too sedated to participate in interview and consult cannot be completed at this time. Will place on Ativan per CIWA for alcohol withdrawl and 1:1 sitter for safety for now until patient can be fully assessed. Will wait until patient is more alert and communicative.  Electronic Signatures: Caryn SectionKapur, Valeria Krisko (MD)  (Signed 06-Feb-13 16:09)  Authored: Brief Consult Note   Last Updated: 06-Feb-13 16:09 by Caryn SectionKapur, Roque Schill (MD)

## 2015-03-18 NOTE — Consult Note (Signed)
Brief Consult Note: Diagnosis: Mood Disorder, NOS, Alcohol Dependence.   Comments: Randy Velez is a 49 y/o Caucasian male brought to the ER via EMS secondary to being unresponsive in the context of alcohol intoxication. He took an unknown quantity of Seroquel in a possible suicide attempt. Patient is now intubated and unable to participate in the interview. Please continue on Ativan per CIWA for alcohol withdrawl and 1:1 sitter for safety for now until patient can be fully assessed. Will wait until patient is extubated and communicative- PLEASE PAGE WHEN PATIENT IS EXTUBATED FOR FULL CONSULT.  Electronic Signatures: Caryn SectionKapur, Aarti (MD)  (Signed 08-Feb-13 08:17)  Authored: Brief Consult Note   Last Updated: 08-Feb-13 08:17 by Caryn SectionKapur, Aarti (MD)

## 2015-03-18 NOTE — Consult Note (Signed)
Brief Consult Note: Diagnosis: Mood Disorder, NOS, Alcohol Dependence.   Comments: [name] is a 49 y/o Caucasian male brought to the ER via EMS secondary to being unresponsive in the context of alcohol intoxication. He took an unknown quantity of Seroquel in a possible suicide attempt. Patient is now intubated and unable to participate in the interview. Please continue on Ativan per CIWA for alcohol withdrawl and 1:1 sitter for safety for now until patient can be fully assessed. Will wait until patient is extubated and communicative.  Electronic Signatures: Caryn SectionKapur, Aarti (MD)  (Signed 07-Feb-13 10:22)  Authored: Brief Consult Note   Last Updated: 07-Feb-13 10:22 by Caryn SectionKapur, Aarti (MD)

## 2015-04-03 NOTE — H&P (Signed)
PATIENT NAME:  Randy Velez, Randy Velez [id number] OF BIRTH:  07-Aug-1966 OF ADMISSION:  01/05/2012 PHYSICIAN:  Darrick MeigsSangeeta Panwar, MDPHYSICIAN:  Doralee AlbinoAarti K. Maryruth BunKapur, MD  REASON FOR ADMISSION: Status post overdose on Seroquel.  INFORMATION: [name] is a 49 year old single Caucasian male with a prior diagnosis of bipolar disorder, alcohol dependence, and borderline personality disorder, currently homeless and unemployed. He is on Disability for the past three years. He has never been married and has no friends.  OF PRESENT ILLNESS: [name] is a 49 year old single Caucasian male with a prior diagnosis of a mood disorder, alcohol dependence-now in partial remission, as well as borderline personality disorder and multiple prior inpatient psychiatric hospitalizations at [facility], who was admitted to the Medicine Service after having overdosed on Seroquel, approximately 25 pills of 300 mg each. The patient was initially admitted to the Critical Care Unit and intubated secondary to respiratory distress. His hospital course has been complicated as well by aspiration pneumonia. Toxicology screen in the Emergency Room was positive for TCAs but negative for other substances, and ethanol level was 118. The patient denies having drank alcohol on a daily basis but says that he drank when he planned to take the pills. He does admit to suicidal intent. The patient says that he and his boyfriend of three months got into an argument, and the patient suspects that his boyfriend has been cheating on him. He said that he [place] walking around in the [place] area and then took the pills. The patient remains worried and preoccupied with what his boyfriend thinks about him. He has a history of having overdosed on Seroquel in the past as well as multiple other suicide attempts, including having drank antifreeze. The patient says that he wanted to leave his boyfriend because he could not handle the fighting but does not want to break up with him. He says  he drank when he took the pills but denies any daily alcohol use or illicit drug use. He does admit to feeling more anxious than depressed but does report problems with frequent crying spells, feelings of hopelessness and decreased energy level. He denies any difficulty with appetite. He has not been sleeping very well even though he does take the Seroquel 600 mg at bedtime. He denies any current active suicidal thoughts but does admit to some intermittent passive suicidal thoughts. He says that when he took the overdose it was impulsive, and he denied planning the suicide attempt and said that he was not having suicidal thoughts prior to the argument with his boyfriend. The patient denies any paranoid thoughts or delusions. He denies any current auditory or visual hallucinations. He does have a history of mood instability, including irritability and some anger outbursts, but denies any grandiose delusions, decreased sleep for several days at a time with increased goal-directed behavior, hyperreligious thoughts, or hypersexual behavior.  patient was intubated in the Emergency Room and transferred to the Critical Care Unit. He has since been extubated and moved out of the Critical Care Unit to the regular medical floor. Chest x-ray did show bibasilar densities, and the patient is currently on antibiotics for aspiration pneumonia. He is fully oriented and is not experiencing any signs of delirium.  PSYCHIATRIC HISTORY: The patient has had a number of prior inpatient psychiatric hospitalizations beginning at the age of 49. He does have a history of being extremely manipulative during prior hospitalizations. [facility] has [facility], [facility], [facility], [facility] and [facility]. He was followed by Dr.  Su at [facility], but after Dr. Janeece Riggers left to go to [facility] the patient is supposed to be seeing Dr. Suzie Portela at [place]. He has his first appointment with him in March. He is currently  taking Seroquel XR 600 mg at bedtime and denies any other psychotropic medications. He has failed multiple other psychotropic medication trials including Remeron, Haldol, Geodon, Risperdal, lithium, Depakote, Celexa and trazodone. He does have a history of multiple suicide attempts by overdose as well as drinking antifreeze. He denies any history of cutting.  PSYCHIATRIC HISTORY: The patient denies any history of any mental illness or substance use in the family.  CARE PHYSICIAN: Dr. Lacie Scotts.  MEDICAL HISTORY: History of cholecystectomy in February 2001. Recent overdose on Seroquel. He denies any history of any TBI or seizures.  OUTPATIENT MEDICATION: Seroquel XR 600 mg at bedtime.   ALLERGIES: Celexa, Depakote, lithium, morphine and trazodone.  ABUSE HISTORY: The patient does have a history of alcohol dependence reports 6 to 7 years of sobriety. He says he drank prior to taking the pills because he wanted to drink if he was going to kill himself. He does have a history of shakes and tremors but no alcohol withdrawal seizures. He also has a history of four DUIs in the past. He denies any cocaine, cannabis, opiate, or stimulant use. He quit smoking 8 to 10 years ago.  HISTORY: The patient was born and raised in the [place] area primarily by his mother and grandparents. His father was killed in a motor vehicle accident in 42 when the patient was 51 years old. Mother still lives in the [place] area, and the patient had lived with his mother in the past but has not been with her for the past two years. He has been homeless for the past one month staying with friends and intermittently a boarding house. Prior to that he was renting a room. He says he uses his Disability check to help pay other people's bills for them, so he does not have enough money for his own living situation. The patient has an eleventh grade education from [employer], and his longest job was in a [employer] for about 6 to 7  years. He has been on Disability since 2009. He has never been married and has no children. He denies any history of any physical or sexual abuse.  HISTORY: The patient does have a history of 5 or 6 arrests in the past, 4 for DUIs and 1 for breaking and entering. He has been incarcerated for a one-year period in the past. He denied any current pending charges.  STATUS EXAM: Mr. Mckay is a 49 year old Caucasian male who is lying in his hospital bed in a hospital gown. He was fully alert and oriented to time, place, and situation. Speech was slow and soft but fluent and coherent, regular rate and rhythm. He was answering questions appropriately, and thought processes were logical and goal directed. Eye contact was fairly good. He did not appear to be agitated. Mood was depressed and affect was congruent. He did endorse some intermittent passive suicidal thoughts but no active suicidal thoughts. He denied any current hallucinations including auditory or visual hallucinations. No paranoid thoughts or delusions. Judgment and insight appeared to be fairly good. Attention and concentration were fairly good. He could name the Presidents backwards to Montura, Sr. and do serial sevens to 86. He did have difficulty spelling world backwards. Abstraction was concrete. RISK ASSESSMENT: At this time, Mr. Defranco remains  at a moderately elevated risk of harm to self and others secondary to recent suicide attempt as well as lack of primary support and other psychosocial problems, including homelessness and financial problems. He denies having any access to guns. The patient was able to contract for safety inside the hospital.  OF SYSTEMS: CONSTITUTIONAL: The patient denies any weakness, fatigue or weight changes. HEENT: He does complain of a headache but no dizziness. EYES: He denies any diplopia or blurred vision. EARS: He denies any difficulty hearing. NECK: He denies any neck pain. RESPIRATORY: He does complain of some shortness  of breath and cough. CARDIOVASCULAR: He denies any chest pain or orthopnea. He denies any syncopal episodes. GASTROINTESTINAL: He denies any nausea, vomiting, or abdominal pain. He denies any change in bowel movements. He denies any dysuria. GENITOURINARY: He denies incontinence or problems with frequency of urine. LYMPHATIC: He denies any anemia or easy bruising. ENDOCRINE: He denies any heat or cold intolerance. MUSCULOSKELETAL: He does complain that his legs feel weak. He denies any other muscle aches or joint pain. NEUROLOGIC: He denies any tingling or weakness. PSYCHIATRIC: Please see the history of present illness.  EXAMINATION: VITAL SIGNS: Blood pressure 134/92, heart rate 89, respirations 20, temperature 98.1. Please see initial physical exam as completed by admitting physician, Dr. Dava Najjar.  DIAGNOSTIC AND RADIOLOGICAL DATA: screen was positive for TCAs but negative for all other substances. level was 118 when he came to the Emergency Room.146, potassium 3.6, chloride 109, CO2 27, BUN 5, creatinine 0.76, glucose 131. blood cell count 8.8, hemoglobin 12.8, platelet count 131. blood gas: pH was 7.42, pO2 78, FiO2 40. showed a ventricular rate of 76 with a QTc interval of 463 on February 7th. x-ray on 01/03/2012 showed mild interval improvement in the appearance of the pulmonary interstitium, left lower lobe atelectasis and/or infiltrate.  I:  1. Bipolar disorder.  2. Alcohol dependence, in partial remission.  II: Borderline personality disorder.  III:  1. Recent overdose on Seroquel.  2. History of cholecystectomy in February 2011.  IV: Severe. Unemployed and on Disability, homelessness, financial problems, lack of primary support.  V: Global Assessment of Functioning score at present equals 25.  AND TREATMENT RECOMMENDATIONS: Mr. Sickinger is a 49 year old Caucasian male with a history of bipolar disorder as well as alcohol dependence, now in partial remission, who presented to the Emergency Room  after overdosing on Seroquel in a suicide attempt. He took approximately 25 pills of 300 mg of Seroquel after a verbal altercation with his boyfriend. The patient reports that the event was impulsive but does admit to some intermittent passive suicidal thoughts. No current psychotic symptoms.  1. Bipolar disorder, most recent episode depressed, without psychotic features: The patient was restarted back on Seroquel XR initially at 200 mg at bedtime with a plan to titrate up to . The patient is not wanting to try other psychotropic medications at this time, and due to history of multiple allergies as well as failures of other psychotropic medications we will plan to restart Seroquel. We will check a lipid panel in the a.m. as well as B12 and folate. We will also consider starting an antidepressant, Prozac 20 mg p.o. daily, which the patient had been during his last hospitalization but has been noncompliant with and did not restart as an outpatient. Please keep on suicide precautions and a one-to-one sitter while on the Medical floor until transferred to Inpatient Psychiatry.  2. History of alcohol dependence, in partial remission: The patient had initially stated  that he was not drinking alcohol on a daily basis and only drank in the context of the overdose. He has been placed on Ativan per CIWA when he initially came to the Emergency Room as he was not able to provide any history at that time. CIWA scores have been low, and he is not experiencing any alcohol withdrawal symptoms. We will plan to discontinue CIWA.  Aspiration pneumonia: Will start Levaquin 500mg  po daily x 5 days per medicine Gastroesophageal reflux disease: We will continue Protonix.  Disposition: Patient is seeking group home placement; will need outpatient psychotropic medication management   Electronic Signatures: Caryn SectionKapur, Aarti (MD)  (Signed on 11-Feb-13 19:39)  Authored  Last Updated: 11-Feb-13 19:39 by Caryn SectionKapur, Aarti (MD)

## 2016-01-14 DIAGNOSIS — F332 Major depressive disorder, recurrent severe without psychotic features: Secondary | ICD-10-CM | POA: Diagnosis not present

## 2016-06-18 DIAGNOSIS — F332 Major depressive disorder, recurrent severe without psychotic features: Secondary | ICD-10-CM | POA: Diagnosis not present

## 2016-07-23 DIAGNOSIS — F332 Major depressive disorder, recurrent severe without psychotic features: Secondary | ICD-10-CM | POA: Diagnosis not present

## 2016-08-17 DIAGNOSIS — R079 Chest pain, unspecified: Secondary | ICD-10-CM | POA: Diagnosis not present

## 2016-08-17 DIAGNOSIS — M25471 Effusion, right ankle: Secondary | ICD-10-CM | POA: Diagnosis not present

## 2016-08-17 DIAGNOSIS — Z87891 Personal history of nicotine dependence: Secondary | ICD-10-CM | POA: Diagnosis not present

## 2016-08-17 DIAGNOSIS — R938 Abnormal findings on diagnostic imaging of other specified body structures: Secondary | ICD-10-CM | POA: Diagnosis not present

## 2016-08-17 DIAGNOSIS — Z79899 Other long term (current) drug therapy: Secondary | ICD-10-CM | POA: Diagnosis not present

## 2016-08-17 DIAGNOSIS — M7989 Other specified soft tissue disorders: Secondary | ICD-10-CM | POA: Diagnosis not present

## 2016-08-17 DIAGNOSIS — M25472 Effusion, left ankle: Secondary | ICD-10-CM | POA: Diagnosis not present

## 2016-08-17 DIAGNOSIS — R0602 Shortness of breath: Secondary | ICD-10-CM | POA: Diagnosis not present

## 2016-08-17 DIAGNOSIS — F319 Bipolar disorder, unspecified: Secondary | ICD-10-CM | POA: Diagnosis not present

## 2016-08-17 DIAGNOSIS — R918 Other nonspecific abnormal finding of lung field: Secondary | ICD-10-CM | POA: Diagnosis not present

## 2016-08-17 DIAGNOSIS — R0902 Hypoxemia: Secondary | ICD-10-CM | POA: Diagnosis not present

## 2016-08-24 DIAGNOSIS — R140 Abdominal distension (gaseous): Secondary | ICD-10-CM | POA: Diagnosis not present

## 2016-08-24 DIAGNOSIS — Z87891 Personal history of nicotine dependence: Secondary | ICD-10-CM | POA: Diagnosis not present

## 2016-08-24 DIAGNOSIS — F329 Major depressive disorder, single episode, unspecified: Secondary | ICD-10-CM | POA: Diagnosis not present

## 2016-08-24 DIAGNOSIS — R600 Localized edema: Secondary | ICD-10-CM | POA: Diagnosis not present

## 2016-08-24 DIAGNOSIS — Z888 Allergy status to other drugs, medicaments and biological substances status: Secondary | ICD-10-CM | POA: Diagnosis not present

## 2016-08-24 DIAGNOSIS — Z885 Allergy status to narcotic agent status: Secondary | ICD-10-CM | POA: Diagnosis not present

## 2016-08-24 DIAGNOSIS — J849 Interstitial pulmonary disease, unspecified: Secondary | ICD-10-CM | POA: Diagnosis not present

## 2016-08-24 DIAGNOSIS — Z79899 Other long term (current) drug therapy: Secondary | ICD-10-CM | POA: Diagnosis not present

## 2016-08-24 DIAGNOSIS — M7989 Other specified soft tissue disorders: Secondary | ICD-10-CM | POA: Diagnosis not present

## 2016-08-24 DIAGNOSIS — F319 Bipolar disorder, unspecified: Secondary | ICD-10-CM | POA: Diagnosis not present

## 2016-08-24 DIAGNOSIS — R0602 Shortness of breath: Secondary | ICD-10-CM | POA: Diagnosis not present

## 2016-09-18 DIAGNOSIS — R42 Dizziness and giddiness: Secondary | ICD-10-CM | POA: Diagnosis not present

## 2016-09-18 DIAGNOSIS — Z23 Encounter for immunization: Secondary | ICD-10-CM | POA: Diagnosis not present

## 2016-09-18 DIAGNOSIS — Z87891 Personal history of nicotine dependence: Secondary | ICD-10-CM | POA: Diagnosis not present

## 2016-09-18 DIAGNOSIS — R0902 Hypoxemia: Secondary | ICD-10-CM | POA: Diagnosis not present

## 2016-09-18 DIAGNOSIS — R0609 Other forms of dyspnea: Secondary | ICD-10-CM | POA: Diagnosis not present

## 2016-09-18 DIAGNOSIS — F319 Bipolar disorder, unspecified: Secondary | ICD-10-CM | POA: Diagnosis not present

## 2016-09-18 DIAGNOSIS — R0602 Shortness of breath: Secondary | ICD-10-CM | POA: Diagnosis not present

## 2016-09-22 DIAGNOSIS — R0609 Other forms of dyspnea: Secondary | ICD-10-CM | POA: Diagnosis not present

## 2016-09-22 DIAGNOSIS — G4733 Obstructive sleep apnea (adult) (pediatric): Secondary | ICD-10-CM | POA: Diagnosis not present

## 2016-09-22 DIAGNOSIS — R0902 Hypoxemia: Secondary | ICD-10-CM | POA: Diagnosis not present

## 2016-09-26 DIAGNOSIS — R0609 Other forms of dyspnea: Secondary | ICD-10-CM | POA: Diagnosis not present

## 2016-09-26 DIAGNOSIS — R918 Other nonspecific abnormal finding of lung field: Secondary | ICD-10-CM | POA: Diagnosis not present

## 2016-09-26 DIAGNOSIS — R0902 Hypoxemia: Secondary | ICD-10-CM | POA: Diagnosis not present

## 2016-09-26 DIAGNOSIS — R0989 Other specified symptoms and signs involving the circulatory and respiratory systems: Secondary | ICD-10-CM | POA: Diagnosis not present

## 2016-09-26 DIAGNOSIS — J9811 Atelectasis: Secondary | ICD-10-CM | POA: Diagnosis not present

## 2016-10-03 DIAGNOSIS — I77819 Aortic ectasia, unspecified site: Secondary | ICD-10-CM | POA: Diagnosis not present

## 2016-10-03 DIAGNOSIS — R0609 Other forms of dyspnea: Secondary | ICD-10-CM | POA: Diagnosis not present

## 2016-10-03 DIAGNOSIS — R0902 Hypoxemia: Secondary | ICD-10-CM | POA: Diagnosis not present

## 2016-10-03 DIAGNOSIS — I517 Cardiomegaly: Secondary | ICD-10-CM | POA: Diagnosis not present

## 2016-10-04 DIAGNOSIS — G4733 Obstructive sleep apnea (adult) (pediatric): Secondary | ICD-10-CM | POA: Diagnosis not present

## 2016-11-12 DIAGNOSIS — G4733 Obstructive sleep apnea (adult) (pediatric): Secondary | ICD-10-CM | POA: Diagnosis not present

## 2016-11-12 DIAGNOSIS — R42 Dizziness and giddiness: Secondary | ICD-10-CM | POA: Diagnosis not present

## 2016-11-12 DIAGNOSIS — Z79899 Other long term (current) drug therapy: Secondary | ICD-10-CM | POA: Diagnosis not present

## 2016-11-12 DIAGNOSIS — R0609 Other forms of dyspnea: Secondary | ICD-10-CM | POA: Diagnosis not present

## 2016-11-12 DIAGNOSIS — F319 Bipolar disorder, unspecified: Secondary | ICD-10-CM | POA: Diagnosis not present

## 2017-01-20 DIAGNOSIS — F312 Bipolar disorder, current episode manic severe with psychotic features: Secondary | ICD-10-CM | POA: Diagnosis not present

## 2017-05-18 DIAGNOSIS — Z79899 Other long term (current) drug therapy: Secondary | ICD-10-CM | POA: Diagnosis not present

## 2017-05-18 DIAGNOSIS — F319 Bipolar disorder, unspecified: Secondary | ICD-10-CM | POA: Diagnosis not present

## 2017-05-18 DIAGNOSIS — Z87891 Personal history of nicotine dependence: Secondary | ICD-10-CM | POA: Diagnosis not present

## 2017-05-18 DIAGNOSIS — N4889 Other specified disorders of penis: Secondary | ICD-10-CM | POA: Diagnosis not present

## 2017-05-18 DIAGNOSIS — N50811 Right testicular pain: Secondary | ICD-10-CM | POA: Diagnosis not present

## 2017-05-18 DIAGNOSIS — N433 Hydrocele, unspecified: Secondary | ICD-10-CM | POA: Diagnosis not present

## 2017-05-18 DIAGNOSIS — N50819 Testicular pain, unspecified: Secondary | ICD-10-CM | POA: Diagnosis not present

## 2017-05-18 DIAGNOSIS — I861 Scrotal varices: Secondary | ICD-10-CM | POA: Diagnosis not present

## 2017-05-18 DIAGNOSIS — N50812 Left testicular pain: Secondary | ICD-10-CM | POA: Diagnosis not present

## 2017-06-01 DIAGNOSIS — I861 Scrotal varices: Secondary | ICD-10-CM | POA: Diagnosis not present

## 2017-06-01 DIAGNOSIS — F319 Bipolar disorder, unspecified: Secondary | ICD-10-CM | POA: Diagnosis not present

## 2017-06-01 DIAGNOSIS — N50812 Left testicular pain: Secondary | ICD-10-CM | POA: Diagnosis not present

## 2017-06-01 DIAGNOSIS — N5082 Scrotal pain: Secondary | ICD-10-CM | POA: Diagnosis not present

## 2017-06-01 DIAGNOSIS — Z87891 Personal history of nicotine dependence: Secondary | ICD-10-CM | POA: Diagnosis not present

## 2017-07-07 DIAGNOSIS — H25813 Combined forms of age-related cataract, bilateral: Secondary | ICD-10-CM | POA: Diagnosis not present

## 2017-07-10 DIAGNOSIS — F312 Bipolar disorder, current episode manic severe with psychotic features: Secondary | ICD-10-CM | POA: Diagnosis not present

## 2018-02-13 ENCOUNTER — Ambulatory Visit
Admission: EM | Admit: 2018-02-13 | Discharge: 2018-02-13 | Disposition: A | Discharge status: Home / Self Care | Payer: Medicare Other | Attending: Family Medicine | Admitting: Family Medicine

## 2018-02-13 ENCOUNTER — Ambulatory Visit: Payer: Medicare Other

## 2018-02-13 ENCOUNTER — Other Ambulatory Visit: Payer: Self-pay

## 2018-02-13 DIAGNOSIS — S82891A Other fracture of right lower leg, initial encounter for closed fracture: Secondary | ICD-10-CM | POA: Insufficient documentation

## 2018-02-13 DIAGNOSIS — Z79891 Long term (current) use of opiate analgesic: Secondary | ICD-10-CM | POA: Diagnosis not present

## 2018-02-13 DIAGNOSIS — S82391A Other fracture of lower end of right tibia, initial encounter for closed fracture: Secondary | ICD-10-CM | POA: Diagnosis not present

## 2018-02-13 DIAGNOSIS — Z809 Family history of malignant neoplasm, unspecified: Secondary | ICD-10-CM | POA: Insufficient documentation

## 2018-02-13 DIAGNOSIS — G4700 Insomnia, unspecified: Secondary | ICD-10-CM | POA: Diagnosis not present

## 2018-02-13 DIAGNOSIS — M7989 Other specified soft tissue disorders: Secondary | ICD-10-CM | POA: Insufficient documentation

## 2018-02-13 DIAGNOSIS — Z9049 Acquired absence of other specified parts of digestive tract: Secondary | ICD-10-CM | POA: Diagnosis not present

## 2018-02-13 DIAGNOSIS — W06XXXA Fall from bed, initial encounter: Secondary | ICD-10-CM | POA: Insufficient documentation

## 2018-02-13 DIAGNOSIS — Z885 Allergy status to narcotic agent status: Secondary | ICD-10-CM | POA: Diagnosis not present

## 2018-02-13 DIAGNOSIS — Z79899 Other long term (current) drug therapy: Secondary | ICD-10-CM | POA: Insufficient documentation

## 2018-02-13 DIAGNOSIS — M25571 Pain in right ankle and joints of right foot: Secondary | ICD-10-CM | POA: Diagnosis not present

## 2018-02-13 DIAGNOSIS — Z888 Allergy status to other drugs, medicaments and biological substances status: Secondary | ICD-10-CM | POA: Insufficient documentation

## 2018-02-13 DIAGNOSIS — Z87891 Personal history of nicotine dependence: Secondary | ICD-10-CM | POA: Diagnosis not present

## 2018-02-13 DIAGNOSIS — W19XXXA Unspecified fall, initial encounter: Secondary | ICD-10-CM | POA: Diagnosis not present

## 2018-02-13 DIAGNOSIS — S99911A Unspecified injury of right ankle, initial encounter: Secondary | ICD-10-CM | POA: Diagnosis not present

## 2018-02-13 HISTORY — DX: Insomnia, unspecified: G47.00

## 2018-02-13 MED ORDER — HYDROCODONE-ACETAMINOPHEN 5-325 MG PO TABS: 1.0000 | ORAL_TABLET | Freq: Four times a day (QID) | ORAL | 0 refills | Status: DC | PRN

## 2018-02-13 NOTE — ED Triage Notes (Signed)
Pt reports he fell off the bed on Monday and injured his right ankle. Ankle pain and limping gait in triage. Pain 10/10. States "it hurts all over" when asked to point to where the pain originates.

## 2018-02-13 NOTE — Discharge Instructions (Signed)
-  REMAIN IN [facility]PL[facility] UNTIL FOLLOW-UP WITH ORTHOPAEDICS -TAKE MEDICATION AS PRESCRIBED -KEEP SPLINT DRY AND INTACT -CALL ON Monday FOR FOLLOW-UP APPOINTMENT.

## 2018-02-13 NOTE — ED Provider Notes (Signed)
[facility]    CSN: [id number] Arrival date & time: 02/13/18  6578     History   Chief Complaint Chief Complaint  Patient presents with  . Ankle Pain    HPI Randy Velez is a 52 y.o. male who presents today for evaluation of right ankle pain.  The patient states that on Monday night he fell out of bed and suffered a twisting injury to the right ankle.  The patient noticed immediate pain and swelling however he did not seek medical treatment until today.  He has been walking on the right ankle which causes significant discomfort, he reports continued increase in swelling.  He denies any trauma or injury since Monday.  No surgical history to the right ankle.  He denies any numbness or tingling to the right lower extremity, the patient is nondiabetic.  He reports significant pain along the medial as well as lateral aspect of the right ankle.  HPI  Past Medical History:  Diagnosis Date  . Insomnia     There are no active problems to display for this patient.   Past Surgical History:  Procedure Laterality Date  . CHOLECYSTECTOMY         Home Medications    Prior to Admission medications   Medication Sig Start Date End Date Taking? Authorizing Provider  ARIPiprazole (ABILIFY) 20 MG tablet Take 20 mg by mouth.    [provider]  doxepin (SINEQUAN) 100 MG capsule Take 100 mg by mouth.    [provider]  HYDROcodone-acetaminophen (NORCO/VICODIN) 5-325 MG tablet Take 1 tablet by mouth every 6 (six) hours as needed. 02/13/18   Anson Oregon, PA-C  QUEtiapine (SEROQUEL) 400 MG tablet Take 400 mg by mouth.    [provider]    Family History Family History  Problem Relation Age of Onset  . Cancer Mother     Social History Social History   Tobacco Use  . Smoking status: Former Smoker    Types: Cigarettes  . Smokeless tobacco: Former Engineer, water Use Topics  . Alcohol use: Not Currently  . Drug use: Not Currently      Allergies   Lithium; Morphine; Risperidone; and Ziprasidone hcl   Review of Systems Review of Systems  Musculoskeletal: Positive for joint swelling.  All other systems reviewed and are negative.  Physical Exam Triage Vital Signs ED Triage Vitals [02/13/18 0922]  Enc Vitals Group     BP      Pulse      Resp      Temp      Temp src      SpO2      Weight 280 lb (127 kg)     Height 6' (1.829 m)     Head Circumference      Peak Flow      Pain Score 10     Pain Loc      Pain Edu?      Excl. in GC?    No data found.  Updated Vital Signs Ht 6' (1.829 m)   Wt 280 lb (127 kg)   BMI 37.97 kg/m   Visual Acuity Right Eye Distance:   Left Eye Distance:   Bilateral Distance:    Right Eye Near:   Left Eye Near:    Bilateral Near:     Physical Exam Skin examination of the right ankle reveals significant swelling without ecchymosis.  Moderate effusion to the right ankle joint.  The patient is  tender to palpation throughout the right ankle including over the medial as well as lateral aspect of the ankle.  Pain with any attempted range of motion to the right ankle.  The patient is intact light touch throughout the right lower extremity.  Cap refill is intact to each individual toe.  Pulses are intact to the right lower extremity.  Prior to leaving today's visit the patient was placed into a short leg posterior splint.  UC Treatments / Results  Labs (all labs ordered are listed, but only abnormal results are displayed) Labs Reviewed - No data to display  EKG None Radiology Dg Ankle Complete Right  Result Date: 02/13/2018 CLINICAL DATA:  52 year old fell Monday and landed on right ankle. Complains of pain and swelling along the lateral malleolus. EXAM: RIGHT ANKLE - COMPLETE 3+ VIEW COMPARISON:  None. FINDINGS: Diffuse soft tissue swelling in the right ankle. There is an oblique lucency in the distal tibia best seen on the lateral image. There may be some buckling along  the posterior distal tibia. Findings concerning for nondisplaced fracture involving the posterior malleolus. Ankle is located. Calcaneus is intact. IMPRESSION: Nondisplaced fracture involving the distal tibia/posterior malleolus. Electronically Signed   By: Richarda OverlieAdam  Henn M.D.   On: 02/13/2018 09:46    Procedures Orthopedic Injury Treatment Date/Time: 02/13/2018 1:26 PM Performed by: Anson OregonMcGhee, James Lance, PA-C Authorized by: Payton Mccallumonty, [place], MD   Consent:    Consent obtained:  Verbal   Consent given by:  Patient   Risks discussed:  Restricted joint movement and stiffness   Alternatives discussed:  No treatment and delayed treatmentInjury location: ankle Location details: right ankle Injury type: fracture Pre-procedure distal perfusion: normal Pre-procedure neurological function: normal Pre-procedure range of motion: reduced  Anesthesia: Local anesthesia used: no  Patient sedated: NoManipulation performed: no Immobilization: splint Splint type: short leg Supplies used: Ortho-Glass Post-procedure neurovascular assessment: post-procedure neurovascularly intact Post-procedure distal perfusion: normal Post-procedure neurological function: normal Post-procedure range of motion: unchanged    (including critical care time)  Medications Ordered in UC Medications - No data to display   Initial Impression / Assessment and Plan / UC Course  I have reviewed the triage vital signs and the nursing notes.  Pertinent labs & imaging results that were available during my care of the patient were reviewed by me and considered in my medical decision making (see chart for details).     1.  Treatment options were discussed today with the patient. 2.  Instructed the patient that x-rays of the right ankle demonstrated a posterior tibial fracture however there is also evidence of widening of the medial ankle joint indicative of possible deltoid ligament injury.   3.  The patient was placed into a  short leg splint and instructed to remain nonweightbearing to the left lower extremity.  The patient was offered and received crutches for assistance with ambulation. 4.  The patient was given the contact information for [facility] who is on-call this weekend, he was instructed to call them on Monday to discuss follow-up appointment.  Final Clinical Impressions(s) / UC Diagnoses   Final diagnoses:  Other closed fracture of distal end of right tibia, initial encounter    ED Discharge Orders        Ordered    HYDROcodone-acetaminophen (NORCO/VICODIN) 5-325 MG tablet  Every 6 hours PRN     02/13/18 1018     Controlled Substance Prescriptions [place] Controlled Substance Registry consulted? Yes, I have consulted the [place] Controlled Substances Registry for this  patient, and feel the risk/benefit ratio today is favorable for proceeding with this prescription for a controlled substance.   Anson Oregon, PA-C 02/13/18 1329

## 2018-02-15 DIAGNOSIS — S82224A Nondisplaced transverse fracture of shaft of right tibia, initial encounter for closed fracture: Secondary | ICD-10-CM | POA: Diagnosis not present

## 2018-02-22 DIAGNOSIS — F332 Major depressive disorder, recurrent severe without psychotic features: Secondary | ICD-10-CM | POA: Diagnosis not present

## 2018-03-08 DIAGNOSIS — S82224A Nondisplaced transverse fracture of shaft of right tibia, initial encounter for closed fracture: Secondary | ICD-10-CM | POA: Diagnosis not present

## 2018-04-05 DIAGNOSIS — S82201D Unspecified fracture of shaft of right tibia, subsequent encounter for closed fracture with routine healing: Secondary | ICD-10-CM | POA: Diagnosis not present

## 2018-04-23 DIAGNOSIS — S82224A Nondisplaced transverse fracture of shaft of right tibia, initial encounter for closed fracture: Secondary | ICD-10-CM | POA: Diagnosis not present

## 2018-06-18 DIAGNOSIS — F331 Major depressive disorder, recurrent, moderate: Secondary | ICD-10-CM | POA: Diagnosis not present

## 2018-08-23 DIAGNOSIS — F332 Major depressive disorder, recurrent severe without psychotic features: Secondary | ICD-10-CM | POA: Diagnosis not present

## 2018-08-30 ENCOUNTER — Emergency Department: Payer: Medicare Other

## 2018-08-30 ENCOUNTER — Other Ambulatory Visit: Payer: Self-pay

## 2018-08-30 ENCOUNTER — Inpatient Hospital Stay
Admission: EM | Admit: 2018-08-30 | Discharge: 2018-09-02 | DRG: 189 | Disposition: A | Discharge status: Home / Self Care | Payer: Medicare Other | Attending: Specialist | Admitting: Specialist

## 2018-08-30 DIAGNOSIS — G4733 Obstructive sleep apnea (adult) (pediatric): Secondary | ICD-10-CM | POA: Diagnosis not present

## 2018-08-30 DIAGNOSIS — Z79891 Long term (current) use of opiate analgesic: Secondary | ICD-10-CM | POA: Diagnosis not present

## 2018-08-30 DIAGNOSIS — Z9049 Acquired absence of other specified parts of digestive tract: Secondary | ICD-10-CM

## 2018-08-30 DIAGNOSIS — Z888 Allergy status to other drugs, medicaments and biological substances status: Secondary | ICD-10-CM | POA: Diagnosis not present

## 2018-08-30 DIAGNOSIS — E1165 Type 2 diabetes mellitus with hyperglycemia: Secondary | ICD-10-CM | POA: Diagnosis present

## 2018-08-30 DIAGNOSIS — F319 Bipolar disorder, unspecified: Secondary | ICD-10-CM | POA: Diagnosis present

## 2018-08-30 DIAGNOSIS — R0902 Hypoxemia: Secondary | ICD-10-CM | POA: Diagnosis not present

## 2018-08-30 DIAGNOSIS — Y95 Nosocomial condition: Secondary | ICD-10-CM | POA: Diagnosis present

## 2018-08-30 DIAGNOSIS — J189 Pneumonia, unspecified organism: Secondary | ICD-10-CM | POA: Diagnosis present

## 2018-08-30 DIAGNOSIS — J9601 Acute respiratory failure with hypoxia: Principal | ICD-10-CM | POA: Diagnosis present

## 2018-08-30 DIAGNOSIS — S299XXA Unspecified injury of thorax, initial encounter: Secondary | ICD-10-CM | POA: Diagnosis not present

## 2018-08-30 DIAGNOSIS — G4700 Insomnia, unspecified: Secondary | ICD-10-CM | POA: Diagnosis present

## 2018-08-30 DIAGNOSIS — Z803 Family history of malignant neoplasm of breast: Secondary | ICD-10-CM

## 2018-08-30 DIAGNOSIS — E662 Morbid (severe) obesity with alveolar hypoventilation: Secondary | ICD-10-CM | POA: Diagnosis present

## 2018-08-30 DIAGNOSIS — M545 Low back pain: Secondary | ICD-10-CM | POA: Diagnosis not present

## 2018-08-30 DIAGNOSIS — Z87891 Personal history of nicotine dependence: Secondary | ICD-10-CM | POA: Diagnosis not present

## 2018-08-30 DIAGNOSIS — Z79899 Other long term (current) drug therapy: Secondary | ICD-10-CM | POA: Diagnosis not present

## 2018-08-30 DIAGNOSIS — W19XXXA Unspecified fall, initial encounter: Secondary | ICD-10-CM | POA: Diagnosis present

## 2018-08-30 DIAGNOSIS — E119 Type 2 diabetes mellitus without complications: Secondary | ICD-10-CM

## 2018-08-30 DIAGNOSIS — Z885 Allergy status to narcotic agent status: Secondary | ICD-10-CM

## 2018-08-30 DIAGNOSIS — I444 Left anterior fascicular block: Secondary | ICD-10-CM | POA: Diagnosis not present

## 2018-08-30 HISTORY — DX: Sleep apnea, unspecified: G47.30

## 2018-08-30 HISTORY — DX: Bipolar disorder, unspecified: F31.9

## 2018-08-30 LAB — URINE DRUG SCREEN, QUALITATIVE (ARMC ONLY)
Amphetamines, Ur Screen: NOT DETECTED
BENZODIAZEPINE, UR SCRN: NOT DETECTED
Barbiturates, Ur Screen: NOT DETECTED
COCAINE METABOLITE, UR ~~LOC~~: NOT DETECTED
Cannabinoid 50 Ng, Ur ~~LOC~~: NOT DETECTED
MDMA (ECSTASY) UR SCREEN: NOT DETECTED
METHADONE SCREEN, URINE: NOT DETECTED
OPIATE, UR SCREEN: NOT DETECTED
PHENCYCLIDINE (PCP) UR S: NOT DETECTED
Tricyclic, Ur Screen: POSITIVE — AB

## 2018-08-30 LAB — COMPREHENSIVE METABOLIC PANEL
ALBUMIN: 3.5 g/dL (ref 3.5–5.0)
ALK PHOS: 49 U/L (ref 38–126)
ALT: 66 U/L — ABNORMAL HIGH (ref 0–44)
ANION GAP: 7 (ref 5–15)
AST: 71 U/L — ABNORMAL HIGH (ref 15–41)
BILIRUBIN TOTAL: 0.6 mg/dL (ref 0.3–1.2)
BUN: 10 mg/dL (ref 6–20)
CO2: 30 mmol/L (ref 22–32)
CREATININE: 0.83 mg/dL (ref 0.61–1.24)
Calcium: 9.2 mg/dL (ref 8.9–10.3)
Chloride: 101 mmol/L (ref 98–111)
GFR calc Af Amer: >60 mL/min (ref >60)
GFR calc non Af Amer: >60 mL/min (ref >60)
GLUCOSE: 234 mg/dL — AB (ref 70–99)
Potassium: 3.9 mmol/L (ref 3.5–5.1)
Sodium: 138 mmol/L (ref 135–145)
Total Protein: 7.1 g/dL (ref 6.5–8.1)

## 2018-08-30 LAB — BLOOD GAS, ARTERIAL
Acid-Base Excess: 3 mmol/L — ABNORMAL HIGH (ref 0.0–2.0)
Bicarbonate: 29.6 mmol/L — ABNORMAL HIGH (ref 20.0–28.0)
FIO2: 0.36
O2 SAT: 92.9 %
PATIENT TEMPERATURE: 37
PO2 ART: 68 mmHg — AB (ref 83.0–108.0)
pCO2 arterial: 50 mmHg — ABNORMAL HIGH (ref 32.0–48.0)
pH, Arterial: 7.38 (ref 7.350–7.450)

## 2018-08-30 LAB — CBC WITH DIFFERENTIAL/PLATELET
BASOS PCT: 1 %
Basophils Absolute: 0.1 10*3/uL (ref 0–0.1)
Eosinophils Absolute: 0.2 10*3/uL (ref 0–0.7)
Eosinophils Relative: 2 %
HEMATOCRIT: 54.4 % — AB (ref 40.0–52.0)
HEMOGLOBIN: 18.3 g/dL — AB (ref 13.0–18.0)
Lymphocytes Relative: 23 %
Lymphs Abs: 1.8 10*3/uL (ref 1.0–3.6)
MCH: 31.3 pg (ref 26.0–34.0)
MCHC: 33.6 g/dL (ref 32.0–36.0)
MCV: 93.3 fL (ref 80.0–100.0)
MONO ABS: 0.6 10*3/uL (ref 0.2–1.0)
MONOS PCT: 7 %
NEUTROS ABS: 5.4 10*3/uL (ref 1.4–6.5)
Neutrophils Relative %: 67 %
Platelets: 141 10*3/uL — ABNORMAL LOW (ref 150–440)
RBC: 5.83 MIL/uL (ref 4.40–5.90)
RDW: 14.2 % (ref 11.5–14.5)
WBC: 8.1 10*3/uL (ref 3.8–10.6)

## 2018-08-30 LAB — GLUCOSE, CAPILLARY
GLUCOSE-CAPILLARY: 101 mg/dL — AB (ref 70–99)
GLUCOSE-CAPILLARY: 163 mg/dL — AB (ref 70–99)
Glucose-Capillary: 112 mg/dL — ABNORMAL HIGH (ref 70–99)

## 2018-08-30 LAB — HEMOGLOBIN A1C
Hgb A1c MFr Bld: 7.3 % — ABNORMAL HIGH (ref 4.8–5.6)
MEAN PLASMA GLUCOSE: 162.81 mg/dL

## 2018-08-30 LAB — LACTIC ACID, PLASMA
Lactic Acid, Venous: 1.7 mmol/L (ref 0.5–1.9)
Lactic Acid, Venous: 1.4 mmol/L (ref 0.5–1.9)

## 2018-08-30 LAB — CREATININE, SERUM: Creatinine, Ser: 0.73 mg/dL (ref 0.61–1.24)

## 2018-08-30 LAB — ETHANOL: Alcohol, Ethyl (B): <10 mg/dL (ref <10)

## 2018-08-30 LAB — CBC
HEMATOCRIT: 53.4 % — AB (ref 40.0–52.0)
Hemoglobin: 18.2 g/dL — ABNORMAL HIGH (ref 13.0–18.0)
MCH: 31.6 pg (ref 26.0–34.0)
MCHC: 34 g/dL (ref 32.0–36.0)
MCV: 93 fL (ref 80.0–100.0)
PLATELETS: 155 10*3/uL (ref 150–440)
RBC: 5.74 MIL/uL (ref 4.40–5.90)
RDW: 14.2 % (ref 11.5–14.5)
WBC: 7.8 10*3/uL (ref 3.8–10.6)

## 2018-08-30 LAB — TROPONIN I: Troponin I: <0.03 ng/mL (ref <0.03)

## 2018-08-30 LAB — LIPASE, BLOOD: Lipase: 34 U/L (ref 11–51)

## 2018-08-30 MED ORDER — DOXEPIN HCL 100 MG PO CAPS
300.0000 mg | ORAL_CAPSULE | Freq: Every day | ORAL | Status: DC
  Administered 2018-08-30 – 2018-09-01 (×3): 300 mg via ORAL
  Filled 2018-08-30 (×4): qty 3

## 2018-08-30 MED ORDER — ONDANSETRON HCL 4 MG/2ML IJ SOLN
4.0000 mg | Freq: Four times a day (QID) | INTRAMUSCULAR | Status: DC | PRN
  Administered 2018-08-31: 4 mg via INTRAVENOUS
  Filled 2018-08-30: qty 2

## 2018-08-30 MED ORDER — QUETIAPINE FUMARATE 200 MG PO TABS
800.0000 mg | ORAL_TABLET | Freq: Every day | ORAL | Status: DC
  Administered 2018-08-30 – 2018-09-01 (×3): 800 mg via ORAL
  Filled 2018-08-30 (×4): qty 4

## 2018-08-30 MED ORDER — ACETAMINOPHEN 325 MG PO TABS: 650.0000 mg | ORAL_TABLET | Freq: Four times a day (QID) | ORAL | Status: DC | PRN

## 2018-08-30 MED ORDER — ENOXAPARIN SODIUM 40 MG/0.4ML ~~LOC~~ SOLN
40.0000 mg | SUBCUTANEOUS | Status: DC
  Administered 2018-08-30 – 2018-09-01 (×3): 40 mg via SUBCUTANEOUS
  Filled 2018-08-30 (×3): qty 0.4

## 2018-08-30 MED ORDER — ACETAMINOPHEN 650 MG RE SUPP: 650.0000 mg | Freq: Four times a day (QID) | RECTAL | Status: DC | PRN

## 2018-08-30 MED ORDER — INSULIN ASPART 100 UNIT/ML ~~LOC~~ SOLN: 0.0000 [IU] | Freq: Every day | SUBCUTANEOUS | Status: DC

## 2018-08-30 MED ORDER — KETOROLAC TROMETHAMINE 30 MG/ML IJ SOLN
30.0000 mg | Freq: Four times a day (QID) | INTRAMUSCULAR | Status: DC | PRN
  Administered 2018-08-30 (×2): 30 mg via INTRAVENOUS
  Filled 2018-08-30 (×2): qty 1

## 2018-08-30 MED ORDER — AZITHROMYCIN 250 MG PO TABS
250.0000 mg | ORAL_TABLET | Freq: Every day | ORAL | Status: DC
  Administered 2018-08-31 – 2018-09-02 (×3): 250 mg via ORAL
  Filled 2018-08-30 (×3): qty 1

## 2018-08-30 MED ORDER — ONDANSETRON HCL 4 MG PO TABS: 4.0000 mg | ORAL_TABLET | Freq: Four times a day (QID) | ORAL | Status: DC | PRN

## 2018-08-30 MED ORDER — AZITHROMYCIN 500 MG IV SOLR
500.0000 mg | INTRAVENOUS | Status: DC
  Administered 2018-08-30: 500 mg via INTRAVENOUS
  Filled 2018-08-30: qty 500

## 2018-08-30 MED ORDER — SODIUM CHLORIDE 0.9 % IV SOLN
2.0000 g | INTRAVENOUS | Status: DC
  Administered 2018-08-30: 2 g via INTRAVENOUS
  Filled 2018-08-30: qty 20

## 2018-08-30 MED ORDER — INSULIN ASPART 100 UNIT/ML ~~LOC~~ SOLN
0.0000 [IU] | Freq: Three times a day (TID) | SUBCUTANEOUS | Status: DC
  Administered 2018-08-31: 1 [IU] via SUBCUTANEOUS
  Administered 2018-08-31: 12:00:00 2 [IU] via SUBCUTANEOUS
  Administered 2018-09-01 (×2): 1 [IU] via SUBCUTANEOUS
  Administered 2018-09-01: 13:00:00 2 [IU] via SUBCUTANEOUS
  Administered 2018-09-02: 09:00:00 1 [IU] via SUBCUTANEOUS
  Administered 2018-09-02: 13:00:00 2 [IU] via SUBCUTANEOUS
  Filled 2018-08-30 (×7): qty 1

## 2018-08-30 MED ORDER — ARIPIPRAZOLE 10 MG PO TABS
20.0000 mg | ORAL_TABLET | Freq: Every day | ORAL | Status: DC
  Administered 2018-08-30 – 2018-09-01 (×3): 20 mg via ORAL
  Filled 2018-08-30 (×4): qty 2

## 2018-08-30 MED ORDER — SODIUM CHLORIDE 0.9 % IV SOLN
1.0000 g | INTRAVENOUS | Status: DC
  Administered 2018-08-31 – 2018-09-02 (×3): 1 g via INTRAVENOUS
  Filled 2018-08-30: qty 1
  Filled 2018-08-30: qty 10
  Filled 2018-08-30 (×2): qty 1

## 2018-08-30 MED ORDER — IOHEXOL 350 MG/ML SOLN
75.0000 mL | Freq: Once | INTRAVENOUS | Status: AC | PRN
  Administered 2018-08-30: 75 mL via INTRAVENOUS

## 2018-08-30 NOTE — ED Triage Notes (Signed)
Patient to ED for a fall at home. States he is currently having left lower back pain from that fall. No other injury indentified.

## 2018-08-30 NOTE — ED Notes (Signed)
Pt O2sat was in low to mid 80s. This RN asked pt to take 3 large deep breaths which increased his O2 up to 94%, but then it quickly dropped back to the mid 80s. Placed pt on 3L nasal cannula for comfort and O2sat rose to 90%. Pt appears sleepy and dozes off every few seconds.

## 2018-08-30 NOTE — Progress Notes (Signed)
Patient c/o lower back pain.  Verbal order by Dr. Cherlynn Kaiser for toradol every 6 hours as needed for pain.  Orson Ape, RN, BSN

## 2018-08-30 NOTE — ED Notes (Signed)
Jenna RN bumped pt O2 up to 6L. For comfort. Pt also informed Eileen Stanford Rn that pt became dizzy then fell, MD informed.

## 2018-08-30 NOTE — ED Notes (Signed)
Pt was still in the high 80s low 90s, bumped O2 up to 4L per nasal cannula.

## 2018-08-30 NOTE — ED Notes (Signed)
Pt attempted urine collection but was unsuccessful. Will attempt again at later time

## 2018-08-30 NOTE — ED Notes (Signed)
Pt changed into blue scrubs. CT called for transport to CT Angio test

## 2018-08-30 NOTE — H&P (Signed)
[facility] [place] [facility]    PATIENT NAME: Randy Velez    MR#:  [id number]  DATE OF BIRTH:  1966-07-13  DATE OF ADMISSION:  08/30/2018  PRIMARY CARE PHYSICIAN: Patient, No Pcp Per   REQUESTING/REFERRING PHYSICIAN: Dr. Jene Every  CHIEF COMPLAINT:   Chief Complaint  Patient presents with  . Fall  Hypoxia  HISTORY OF PRESENT ILLNESS:  Randy Velez  is a 52 y.o. male with a known history of Polar disorder, insomnia, obstructive sleep apnea who presents to the hospital after a fall overnight and was complaining of some left lower back pain and therefore brought to the ER for further evaluation.  Patient cannot recall the premise of his fall.  He did not have any true syncope and denied any prodromal symptoms prior to his fall.  He cannot really truly recall the events.  He presents to the ER due to his left lower back pain but incidentally was also noted to be hypoxic.  Patient underwent CT scan of his chest which showed no evidence of pulmonary embolism but did show evidence of possible right-sided pneumonia.  Hospitalist services were contacted for admission.  Patient denies any fevers, chills, cough, congestion, chest pain, nausea, vomiting or any other associated symptoms presently.  PAST MEDICAL HISTORY:   Past Medical History:  Diagnosis Date  . Bipolar disorder (HCC)   . Insomnia   . Sleep apnea     PAST SURGICAL HISTORY:   Past Surgical History:  Procedure Laterality Date  . CHOLECYSTECTOMY      SOCIAL HISTORY:   Social History   Tobacco Use  . Smoking status: Former Smoker    Packs/day: 1.00    Years: 2.00    Pack years: 2.00    Types: Cigarettes  . Smokeless tobacco: Former Engineer, water Use Topics  . Alcohol use: Not Currently    FAMILY HISTORY:   Family History  Problem Relation Age of Onset  . Breast cancer Mother     DRUG ALLERGIES:   Allergies  Allergen Reactions  . Lithium   . Morphine Other (See Comments)     Short of breath  . Risperidone   . Ziprasidone Hcl Anxiety    REVIEW OF SYSTEMS:   Review of Systems  Constitutional: Negative for fever and weight loss.  HENT: Negative for congestion, nosebleeds and tinnitus.   Eyes: Negative for blurred vision, double vision and redness.  Respiratory: Negative for cough, hemoptysis and shortness of breath.   Cardiovascular: Negative for chest pain, orthopnea, leg swelling and PND.  Gastrointestinal: Negative for abdominal pain, diarrhea, melena, nausea and vomiting.  Genitourinary: Negative for dysuria, hematuria and urgency.  Musculoskeletal: Positive for falls. Negative for joint pain.  Neurological: Negative for dizziness, tingling, sensory change, focal weakness, seizures, weakness and headaches.  Endo/Heme/Allergies: Negative for polydipsia. Does not bruise/bleed easily.  Psychiatric/Behavioral: Negative for depression and memory loss. The patient is not nervous/anxious.     MEDICATIONS AT HOME:   Prior to Admission medications   Medication Sig Start Date End Date Taking? Authorizing Provider  ARIPiprazole (ABILIFY) 20 MG tablet Take 20 mg by mouth at bedtime.    Yes [provider]  doxepin (SINEQUAN) 100 MG capsule Take 300 mg by mouth at bedtime.    Yes [provider]  QUEtiapine (SEROQUEL) 400 MG tablet Take 800 mg by mouth at bedtime.    Yes [provider]  HYDROcodone-acetaminophen (NORCO/VICODIN) 5-325 MG tablet Take 1 tablet by mouth every  6 (six) hours as needed. Patient not taking: Reported on 08/30/2018 02/13/18   Anson Oregon, PA-C      VITAL SIGNS:  Blood pressure 108/89, pulse 90, temperature 98.1 F (36.7 C), temperature source Oral, resp. rate 17, height 6' (1.829 m), weight 127 kg, SpO2 (!) 89 %.  PHYSICAL EXAMINATION:  Physical Exam  GENERAL:  52 y.o.-year-old obese patient lying in the bed in no acute distress.  EYES: Pupils equal, round, reactive to light and accommodation. No  scleral icterus. Extraocular muscles intact.  HEENT: Head atraumatic, normocephalic. Oropharynx and nasopharynx clear. No oropharyngeal erythema, moist oral mucosa  NECK:  Supple, no jugular venous distention. No thyroid enlargement, no tenderness.  LUNGS: Normal breath sounds bilaterally, no wheezing, rales, rhonchi. No use of accessory muscles of respiration.  CARDIOVASCULAR: S1, S2 RRR. No murmurs, rubs, gallops, clicks.  ABDOMEN: Soft, nontender, nondistended. Bowel sounds present. No organomegaly or mass.  EXTREMITIES: No pedal edema, cyanosis, or clubbing. + 2 pedal & radial pulses b/l.   NEUROLOGIC: Cranial nerves II through XII are intact. No focal Motor or sensory deficits appreciated b/l PSYCHIATRIC: The patient is alert and oriented x 3.   SKIN: No obvious rash, lesion, or ulcer.   LABORATORY PANEL:   CBC Recent Labs  Lab 08/30/18 0637  WBC 8.1  HGB 18.3*  HCT 54.4*  PLT 141*   ------------------------------------------------------------------------------------------------------------------  Chemistries  Recent Labs  Lab 08/30/18 0637  NA 138  K 3.9  CL 101  CO2 30  GLUCOSE 234*  BUN 10  CREATININE 0.83  CALCIUM 9.2  AST 71*  ALT 66*  ALKPHOS 49  BILITOT 0.6   ------------------------------------------------------------------------------------------------------------------  Cardiac Enzymes Recent Labs  Lab 08/30/18 0637  TROPONINI <0.03   ------------------------------------------------------------------------------------------------------------------  RADIOLOGY:  Dg Chest 2 View  Result Date: 08/30/2018 CLINICAL DATA:  Fall at home today.  Back pain. EXAM: CHEST - 2 VIEW COMPARISON:  01/03/2012 FINDINGS: The heart size and mediastinal contours are within normal limits. Linear opacity at the right lung base may be due to atelectasis or scarring. No evidence of pulmonary consolidation or pleural effusion. IMPRESSION: Right basilar atelectasis versus  scarring. Electronically Signed   By: Myles Rosenthal M.D.   On: 08/30/2018 07:05   Ct Angio Chest Pe W And/or Wo Contrast  Result Date: 08/30/2018 CLINICAL DATA:  Hypoxia.  Status in 62s.  The patient is sleepy. EXAM: CT ANGIOGRAPHY CHEST WITH CONTRAST TECHNIQUE: Multidetector CT imaging of the chest was performed using the standard protocol during bolus administration of intravenous contrast. Multiplanar CT image reconstructions and MIPs were obtained to evaluate the vascular anatomy. CONTRAST:  75mL OMNIPAQUE IOHEXOL 350 MG/ML SOLN COMPARISON:  08/30/2018 FINDINGS: Cardiovascular: Pulmonary arteries are well opacified by contrast bolus. There is no acute pulmonary embolus. Heart size is normal. No pericardial effusion. Thoracic aorta is normal in appearance without significant atherosclerotic calcifications. No significant coronary artery calcifications. Mediastinum/Nodes: No enlarged mediastinal, hilar, or axillary lymph nodes. Thyroid gland, trachea, and esophagus demonstrate no significant findings. Lungs/Pleura: There are ground-glass opacities within the RIGHT UPPER lobe and dependent aspects of the RIGHT LOWER lobe. There is atelectasis or consolidation of the RIGHT middle lobe. There are similar ground-glass opacities in the LEFT LOWER lobe. No suspicious pulmonary nodules. No pleural effusions. Upper Abdomen: Cholecystectomy. Diffuse low-attenuation of the liver consistent with hepatic steatosis. Musculoskeletal: No chest wall abnormality. No acute or significant osseous findings. Review of the MIP images confirms the above findings. IMPRESSION: 1. Technically adequate exam showing no acute  pulmonary embolus. 2. Patchy airspace filling opacities bilaterally, with consolidation/atelectasis in the RIGHT middle lobe. Findings favor infection over edema. 3. Hepatic steatosis. 4. No significant atherosclerotic calcification of the coronary arteries or thoracic aorta. Electronically Signed   By: Norva Pavlov M.D.   On: 08/30/2018 08:44     IMPRESSION AND PLAN:   52 year old male with past medical history of bipolar disorder, obstructive sleep apnea, insomnia who presents to the hospital after a fall and noted to be hypoxic.  1.  Acute respiratory failure with hypoxia- suspected to be secondary to underlying pneumonia.  Patient CT chest was negative for pulmonary embolism. - We will treat the patient for community acquired pneumonia with IV ceftriaxone, Zithromax, continue O2 supplementation. - Patient could have underlying sleep apnea and therefore we will get ABG to rule out hypercapnia.  2.  Pneumonia- Spector to be community-acquired pneumonia. -We will treat the patient with IV ceftriaxone, Zithromax.  Follow cultures.  3.  Hyperglycemia-patient has a random blood sugar greater than 200.  No previous history of diabetes. - I will check a hemoglobin A 1C, place him on sliding scale insulin.  If needed would consult diabetes coordinator.  4.  History of bipolar disorder- continue Abilify, Seroquel, doxepin.    All the records are reviewed and case discussed with ED provider. Management plans discussed with the patient, family and they are in agreement.  CODE STATUS: Full code  TOTAL TIME TAKING CARE OF THIS PATIENT: 45 minutes.    Houston Siren M.D on 08/30/2018 at 9:31 AM  Between 7am to 6pm - Pager - [phone]  After 6pm go to [email or web address] - password [email or web address]  [facility]  Office  [phone]  CC: Primary care physician; Patient, No Pcp Per

## 2018-08-30 NOTE — ED Notes (Signed)
Oxygen reduced to 4L

## 2018-08-30 NOTE — ED Notes (Signed)
MD informed about this RN concern over his O2 level

## 2018-08-30 NOTE — ED Provider Notes (Signed)
[facility] Emergency Department Provider Note   ____________________________________________    I have reviewed the triage vital signs and the nursing notes.   HISTORY  Chief Complaint Fall     HPI Randy Velez is a 52 y.o. male who presents after reported fall.  Patient reports he fell at approximately 5 AM.  He does not know why, he states that he was in the kitchen and "just fell ".  Denies chest pain or palpitations.  Reports his breathing feels normal.  No headache or neuro deficits.  No cough fever pleurisy.  No nausea or vomiting.  Complains of some mild left lumbar paraspinal discomfort status post fall.  No saddle anesthesia or concerning symptoms   Past Medical History:  Diagnosis Date  . Insomnia     There are no active problems to display for this patient.   Past Surgical History:  Procedure Laterality Date  . CHOLECYSTECTOMY      Prior to Admission medications   Medication Sig Start Date End Date Taking? Authorizing Provider  ARIPiprazole (ABILIFY) 20 MG tablet Take 20 mg by mouth.    [provider]  doxepin (SINEQUAN) 100 MG capsule Take 100 mg by mouth.    [provider]  HYDROcodone-acetaminophen (NORCO/VICODIN) 5-325 MG tablet Take 1 tablet by mouth every 6 (six) hours as needed. 02/13/18   Anson Oregon, PA-C  QUEtiapine (SEROQUEL) 400 MG tablet Take 400 mg by mouth.    [provider]     Allergies Lithium; Morphine; Risperidone; and Ziprasidone hcl  Family History  Problem Relation Age of Onset  . Cancer Mother     Social History Social History   Tobacco Use  . Smoking status: Former Smoker    Types: Cigarettes  . Smokeless tobacco: Former Engineer, water Use Topics  . Alcohol use: Not Currently  . Drug use: Not Currently    Review of Systems  Constitutional: No fever/chills Eyes: No visual changes.  ENT: No neck pain Cardiovascular: Denies chest pain.  No  palpitations Respiratory: Denies shortness of breath. Gastrointestinal: No abdominal pain.  No nausea, no vomiting.   Genitourinary: Negative for dysuria. Musculoskeletal: As above Skin: Negative for abrasion or laceration Neurological: Negative for headaches or weakness   ____________________________________________   PHYSICAL EXAM:  VITAL SIGNS: ED Triage Vitals [08/30/18 0553]  Enc Vitals Group     BP 120/81     Pulse Rate (!) 109     Resp 18     Temp 98.1 F (36.7 C)     Temp Source Oral     SpO2 96 %     Weight 127 kg (280 lb)     Height 1.829 m (6')     Head Circumference      Peak Flow      Pain Score 8     Pain Loc      Pain Edu?      Excl. in GC?     Constitutional: Alert and oriented.   Head: Atraumatic. Nose: No congestion/rhinnorhea. Mouth/Throat: Mucous membranes are moist.   Neck:  Painless ROM, no vertebral tenderness to palpation Cardiovascular: Normal rate, regular rhythm. Grossly normal heart sounds.  Good peripheral circulation. Respiratory: Normal respiratory effort.  No retractions. Lungs CTAB. Gastrointestinal: Soft and nontender. No distention.    Musculoskeletal: .  Warm and well perfused Neurologic:  Normal speech and language. No gross focal neurologic deficits are appreciated.  Skin:  Skin is warm, dry and  intact. No rash noted. Psychiatric: Mood and affect are normal. Speech and behavior are normal.  ____________________________________________   LABS (all labs ordered are listed, but only abnormal results are displayed)  Labs Reviewed  CBC WITH DIFFERENTIAL/PLATELET - Abnormal; Notable for the following components:      Result Value   Hemoglobin 18.3 (*)    HCT 54.4 (*)    Platelets 141 (*)    All other components within normal limits  COMPREHENSIVE METABOLIC PANEL - Abnormal; Notable for the following components:   Glucose, Bld 234 (*)    AST 71 (*)    ALT 66 (*)    All other components within normal limits  CULTURE,  BLOOD (ROUTINE X 2)  CULTURE, BLOOD (ROUTINE X 2)  LIPASE, BLOOD  TROPONIN I  ETHANOL  URINE DRUG SCREEN, QUALITATIVE ([facility] ONLY)  LACTIC ACID, PLASMA  LACTIC ACID, PLASMA   ____________________________________________  EKG  ED ECG REPORT I, Jene Every, the attending physician, personally viewed and interpreted this ECG.  Date: 08/30/2018  Rhythm: normal sinus rhythm QRS Axis: normal Intervals: normal ST/T Wave abnormalities: normal Narrative Interpretation: no evidence of acute ischemia  ____________________________________________  RADIOLOGY  Chest x-ray unremarkable CT angiography demonstrates bilateral multi lobar pneumonia ____________________________________________   PROCEDURES  Procedure(s) performed: No  Procedures   Critical Care performed: yes  CRITICAL CARE Performed by: Jene Every   Total critical care time: 30 minutes  Critical care time was exclusive of separately billable procedures and treating other patients.  Critical care was necessary to treat or prevent imminent or life-threatening deterioration.  Critical care was time spent personally by me on the following activities: development of treatment plan with patient and/or surrogate as well as nursing, discussions with consultants, evaluation of patient's response to treatment, examination of patient, obtaining history from patient or surrogate, ordering and performing treatments and interventions, ordering and review of laboratory studies, ordering and review of radiographic studies, pulse oximetry and re-evaluation of patient's condition.  ____________________________________________   INITIAL IMPRESSION / ASSESSMENT AND PLAN / ED COURSE  Pertinent labs & imaging results that were available during my care of the patient were reviewed by me and considered in my medical decision making (see chart for details).  Patient presents after a fall.  He is unable to provide significant  details, differential includes syncopal episode, no neuro deficits to suggest stroke.  Review of medical records demonstrates a history of intentional drug overdose secondary to psychiatric illness.  Most concerning is that he is markedly hypoxic on room air and is requiring 4 to 6 L of nasal cannula oxygen.  His lab work is unremarkable his chest x-ray shows possibly some atelectasis, will send for CT angiography to further evaluate his hypoxia.  CT angiography demonstrates multi lobar pneumonia, will add on blood cultures lactic acid treat with Rocephin and azithromycin IV patient will require admission for continued hypoxia/respiratory failure secondary to pneumonia    ____________________________________________   FINAL CLINICAL IMPRESSION(S) / ED DIAGNOSES  Final diagnoses:  Acute respiratory failure with hypoxia (HCC)  Community acquired pneumonia, unspecified laterality        Note:  This document was prepared using Conservation officer, historic buildings and may include unintentional dictation errors.    Jene Every, MD 08/30/18 [phone]

## 2018-08-30 NOTE — ED Notes (Signed)
RT at bedside.

## 2018-08-30 NOTE — ED Notes (Signed)
Pt's mother Skip Mayer) left her phone number [phone]

## 2018-08-31 LAB — BASIC METABOLIC PANEL
Anion gap: 7 (ref 5–15)
BUN: 16 mg/dL (ref 6–20)
CALCIUM: 8.8 mg/dL — AB (ref 8.9–10.3)
CO2: 33 mmol/L — ABNORMAL HIGH (ref 22–32)
Chloride: 100 mmol/L (ref 98–111)
Creatinine, Ser: 0.8 mg/dL (ref 0.61–1.24)
GFR calc Af Amer: >60 mL/min (ref >60)
GLUCOSE: 165 mg/dL — AB (ref 70–99)
Potassium: 4.5 mmol/L (ref 3.5–5.1)
SODIUM: 140 mmol/L (ref 135–145)

## 2018-08-31 LAB — GLUCOSE, CAPILLARY
GLUCOSE-CAPILLARY: 147 mg/dL — AB (ref 70–99)
GLUCOSE-CAPILLARY: 198 mg/dL — AB (ref 70–99)
GLUCOSE-CAPILLARY: 174 mg/dL — AB (ref 70–99)
Glucose-Capillary: 105 mg/dL — ABNORMAL HIGH (ref 70–99)

## 2018-08-31 LAB — CBC
HCT: 57.5 % — ABNORMAL HIGH (ref 40.0–52.0)
Hemoglobin: 18.8 g/dL — ABNORMAL HIGH (ref 13.0–18.0)
MCH: 31.5 pg (ref 26.0–34.0)
MCHC: 32.6 g/dL (ref 32.0–36.0)
MCV: 96.6 fL (ref 80.0–100.0)
PLATELETS: 141 10*3/uL — AB (ref 150–440)
RBC: 5.96 MIL/uL — ABNORMAL HIGH (ref 4.40–5.90)
RDW: 14.8 % — ABNORMAL HIGH (ref 11.5–14.5)
WBC: 7.8 10*3/uL (ref 3.8–10.6)

## 2018-08-31 LAB — HIV ANTIBODY (ROUTINE TESTING W REFLEX): HIV SCREEN 4TH GENERATION: NONREACTIVE

## 2018-08-31 MED ORDER — METFORMIN HCL 500 MG PO TABS
500.0000 mg | ORAL_TABLET | Freq: Two times a day (BID) | ORAL | Status: DC
  Administered 2018-08-31 – 2018-09-02 (×4): 500 mg via ORAL
  Filled 2018-08-31 (×5): qty 1

## 2018-08-31 MED ORDER — LIVING WELL WITH DIABETES BOOK
Freq: Once | Status: AC
  Administered 2018-08-31: 18:00:00
  Filled 2018-08-31 (×2): qty 1

## 2018-08-31 MED ORDER — SODIUM CHLORIDE 0.9 % IV SOLN
INTRAVENOUS | Status: DC | PRN
  Administered 2018-08-31: 10:00:00 10 mL via INTRAVENOUS

## 2018-08-31 NOTE — Progress Notes (Signed)
[facility] - [place] at [facility]      PATIENT NAME: Randy Velez    MR#:  [id number]  DATE OF BIRTH:  Dec 05, 1965  SUBJECTIVE:   Patient denies any shortness of breath, chest pain.  Became quite hypoxic upon ambulation.  Patient's hemoglobin A1c was noted to be as high as 7.3 and patient has underlying diabetes.  REVIEW OF SYSTEMS:    Review of Systems  Constitutional: Negative for chills and fever.  HENT: Negative for congestion and tinnitus.   Eyes: Negative for blurred vision and double vision.  Respiratory: Negative for cough, shortness of breath and wheezing.   Cardiovascular: Negative for chest pain, orthopnea and PND.  Gastrointestinal: Negative for abdominal pain, diarrhea, nausea and vomiting.  Genitourinary: Negative for dysuria and hematuria.  Neurological: Negative for dizziness, sensory change and focal weakness.  All other systems reviewed and are negative.   Nutrition: Carb control Tolerating Diet: yes Tolerating PT: Await Eval.      DRUG ALLERGIES:   Allergies  Allergen Reactions  . Lithium   . Morphine Other (See Comments)    Short of breath  . Risperidone   . Ziprasidone Hcl Anxiety    VITALS:  Blood pressure 110/84, pulse 92, temperature 98.3 F (36.8 C), temperature source Oral, resp. rate 20, height 6' (1.829 m), weight 129.9 kg, SpO2 90 %.  PHYSICAL EXAMINATION:   Physical Exam  GENERAL:  52 y.o.-year-old obese patient lying in bed in no acute distress.  EYES: Pupils equal, round, reactive to light and accommodation. No scleral icterus. Extraocular muscles intact.  HEENT: Head atraumatic, normocephalic. Oropharynx and nasopharynx clear.  NECK:  Supple, no jugular venous distention. No thyroid enlargement, no tenderness.  LUNGS: Normal breath sounds bilaterally, no wheezing, rales, rhonchi. No use of accessory muscles of respiration.  CARDIOVASCULAR: S1, S2 normal. No murmurs, rubs, or gallops.  ABDOMEN: Soft,  nontender, nondistended. Bowel sounds present. No organomegaly or mass.  EXTREMITIES: No cyanosis, clubbing or edema b/l.    NEUROLOGIC: Cranial nerves II through XII are intact. No focal Motor or sensory deficits b/l.   PSYCHIATRIC: The patient is alert and oriented x 3.  SKIN: No obvious rash, lesion, or ulcer.    LABORATORY PANEL:   CBC Recent Labs  Lab 08/31/18 0600  WBC 7.8  HGB 18.8*  HCT 57.5*  PLT 141*   ------------------------------------------------------------------------------------------------------------------  Chemistries  Recent Labs  Lab 08/30/18 0637  08/31/18 0600  NA 138  --  140  K 3.9  --  4.5  CL 101  --  100  CO2 30  --  33*  GLUCOSE 234*  --  165*  BUN 10  --  16  CREATININE 0.83   < > 0.80  CALCIUM 9.2  --  8.8*  AST 71*  --   --   ALT 66*  --   --   ALKPHOS 49  --   --   BILITOT 0.6  --   --    < > = values in this interval not displayed.   ------------------------------------------------------------------------------------------------------------------  Cardiac Enzymes Recent Labs  Lab 08/30/18 0637  TROPONINI <0.03   ------------------------------------------------------------------------------------------------------------------  RADIOLOGY:  Dg Chest 2 View  Result Date: 08/30/2018 CLINICAL DATA:  Fall at home today.  Back pain. EXAM: CHEST - 2 VIEW COMPARISON:  01/03/2012 FINDINGS: The heart size and mediastinal contours are within normal limits. Linear opacity at the right lung base may be due to atelectasis or scarring. No evidence of pulmonary consolidation  or pleural effusion. IMPRESSION: Right basilar atelectasis versus scarring. Electronically Signed   By: Myles Rosenthal M.D.   On: 08/30/2018 07:05   Ct Angio Chest Pe W And/or Wo Contrast  Result Date: 08/30/2018 CLINICAL DATA:  Hypoxia.  Status in 100s.  The patient is sleepy. EXAM: CT ANGIOGRAPHY CHEST WITH CONTRAST TECHNIQUE: Multidetector CT imaging of the chest was  performed using the standard protocol during bolus administration of intravenous contrast. Multiplanar CT image reconstructions and MIPs were obtained to evaluate the vascular anatomy. CONTRAST:  75mL OMNIPAQUE IOHEXOL 350 MG/ML SOLN COMPARISON:  08/30/2018 FINDINGS: Cardiovascular: Pulmonary arteries are well opacified by contrast bolus. There is no acute pulmonary embolus. Heart size is normal. No pericardial effusion. Thoracic aorta is normal in appearance without significant atherosclerotic calcifications. No significant coronary artery calcifications. Mediastinum/Nodes: No enlarged mediastinal, hilar, or axillary lymph nodes. Thyroid gland, trachea, and esophagus demonstrate no significant findings. Lungs/Pleura: There are ground-glass opacities within the RIGHT UPPER lobe and dependent aspects of the RIGHT LOWER lobe. There is atelectasis or consolidation of the RIGHT middle lobe. There are similar ground-glass opacities in the LEFT LOWER lobe. No suspicious pulmonary nodules. No pleural effusions. Upper Abdomen: Cholecystectomy. Diffuse low-attenuation of the liver consistent with hepatic steatosis. Musculoskeletal: No chest wall abnormality. No acute or significant osseous findings. Review of the MIP images confirms the above findings. IMPRESSION: 1. Technically adequate exam showing no acute pulmonary embolus. 2. Patchy airspace filling opacities bilaterally, with consolidation/atelectasis in the RIGHT middle lobe. Findings favor infection over edema. 3. Hepatic steatosis. 4. No significant atherosclerotic calcification of the coronary arteries or thoracic aorta. Electronically Signed   By: Norva Pavlov M.D.   On: 08/30/2018 08:44     ASSESSMENT AND PLAN:   52 year old male with past medical history of bipolar disorder, obstructive sleep apnea, insomnia who presents to the hospital after a fall and noted to be hypoxic.  1.  Acute respiratory failure with hypoxia- suspected to be secondary to  underlying pneumonia.  Patient CT chest was negative for pulmonary embolism. -Continue O2 supplementation, continue empiric antibiotics with ceftriaxone, Zithromax. - Patient was ambulated and became quite hypoxic and may need home oxygen upon discharge. - he likely has underlying sleep apnea will need to have a sleep study as an outpatient.  2.  Pneumonia- due to CAP. - cont. IV Ceftriaxone, Zithromax.  Cultures (-) so far.   3.  Hyperglycemia-patient had a random blood sugar greater than 200.  - A1c is 7.3. Likely  New onset DM. Appreciate DM coordinator input. Started on Metformin and will need DM Education.  - will given glucometer upon discharge.   4.  History of bipolar disorder- continue Abilify, Seroquel, doxepin.  All the records are reviewed and case discussed with Care Management/Social Worker. Management plans discussed with the patient, family and they are in agreement.  CODE STATUS: Full code  DVT Prophylaxis: Lovenox  TOTAL TIME TAKING CARE OF THIS PATIENT: 30 minutes.   POSSIBLE D/C IN 1-2 DAYS, DEPENDING ON CLINICAL CONDITION.   Houston Siren M.D on 08/31/2018 at 3:35 PM  Between 7am to 6pm - Pager - [phone]  After 6pm go to [email or web address] - Social research officer, government  [facility] [place] [facility]  Office  [phone]  CC: Primary care physician; Patient, No Pcp Per

## 2018-08-31 NOTE — Progress Notes (Addendum)
Inpatient Diabetes Program Recommendations  AACE/ADA: New Consensus Statement on Inpatient Glycemic Control (2015)  Target Ranges:  Prepandial:   less than 140 mg/dL      Peak postprandial:   less than 180 mg/dL (1-2 hours)      Critically ill patients:  140 - 180 mg/dL   Lab Results  Component Value Date   GLUCAP 147 (H) 08/31/2018   HGBA1C 7.3 (H) 08/30/2018   Inpatient Diabetes Program Recommendations:    Note elevated A1c. ? New diagnosis of DM.  If so, MD please advise patient so that survival skill education can be done prior to d/c.   Thanks, Beryl Meager, RN, BC-ADM Inpatient Diabetes Coordinator Pager [phone]; Addendum: referral received for new diagnosis of DM.  Discussed with patient at bedside.  Briefly discussed what diabetes is and medication called metformin that will be started.  Discussed that it sometimes causes GI upset or diarrhea and should be taken with food.  Patient slow with responses but states that he understand.  We discussed what diabetes is and the importance of lifestyle modifications including eliminating sugar from beverages, increasing activity/exercise, and the importance of f/u with primary MD. Patient states that he does not have a doctor.  Will request CM consult for assistance with PCP.  Discussed importance of foot care and long term complications of DM.  Patient asked "how long do I have to take the new medicine?" Explained that he will need to take metformin indefinitely unless MD discontinues. Patient verbalized understanding however seems to need significant prompting with questions and answers.  Discussed normal blood sugar values as well as monitoring (however doubt he will need daily monitoring with just oral agents prescribed).  Needs close follow-up.  Will order outpatient DM education as well/Co-sign required.  Discussed with RN.  LWWD booklet ordered and nutrition consult.

## 2018-08-31 NOTE — Evaluation (Signed)
Physical Therapy Evaluation Patient Details Name: EUTIMIO GHARIBIAN MRN: [id number] DOB: 12-16-1965 Today's Date: 08/31/2018   History of Present Illness  Pt is a 52 y.o male presenting after a mechanical fall on 08/30/18 with pt unable to identify any reasons why he fell. PMH significant for bipolar disorder and insomnia.   Clinical Impression  Pt alert and oriented upon arrival, enthused to work with PT.  Pt is typically not on O2 at home thus this is not baseline. Pt demonstrating functional strength and full independence with bed mobility and transfers. Pt ambulating 160 feet with no AD, supervision for safety, and no reports of lightheadedness, dizziness, or balance disturbances. Pt currently on 2L of O2 however this has been weaned from 5L since admission. SpO2 88% at rest on 2L and dropping as low as 85% in sitting. Increased O2 to 3L for ambulation with SpO2 remaining in high 80's t/o. When returned to room pt placed on 2L of O2 with SpO2 fluctuating between 89-92 RN notified. Aside from the need for supplemental O2, pt is at their baseline for mobility and does not require PT services.      Follow Up Recommendations No PT follow up    Equipment Recommendations       Recommendations for Other Services       Precautions / Restrictions Precautions Precautions: None      Mobility  Bed Mobility Overal bed mobility: Independent Bed Mobility: Supine to Sit;Sit to Supine     Supine to sit: Independent Sit to supine: Independent      Transfers Overall transfer level: Independent   Transfers: Sit to/from Stand              Ambulation/Gait Ambulation/Gait assistance: Supervision Gait Distance (Feet): 160 Feet Assistive device: None       General Gait Details: Pt with no gait or balance deficits. Ambulation is currently at baseline functionally. On 3L high 80's t/o amb.   Stairs            Wheelchair Mobility    Modified Rankin (Stroke Patients Only)        Balance Overall balance assessment: Independent                                           Pertinent Vitals/Pain Pain Assessment: 0-10 Pain Score: 3  Pain Location: low back Pain Intervention(s): Monitored during session    Home Living Family/patient expects to be discharged to:: Private residence Living Arrangements: Other relatives;Parent   Type of Home: House       Home Layout: One level Home Equipment: None      Prior Function Level of Independence: Independent               Hand Dominance        Extremity/Trunk Assessment   Upper Extremity Assessment Upper Extremity Assessment: Overall WFL for tasks assessed    Lower Extremity Assessment Lower Extremity Assessment: Overall WFL for tasks assessed       Communication   Communication: No difficulties  Cognition Arousal/Alertness: Awake/alert Behavior During Therapy: WFL for tasks assessed/performed Overall Cognitive Status: Within Functional Limits for tasks assessed                                        General  Comments      Exercises     Assessment/Plan    PT Assessment Patent does not need any further PT services  PT Problem List Cardiopulmonary status limiting activity       PT Treatment Interventions      PT Goals (Current goals can be found in the Care Plan section)  Acute Rehab PT Goals PT Goal Formulation: All assessment and education complete, DC therapy    Frequency     Barriers to discharge        Co-evaluation               AM-PAC PT "6 Clicks" Daily Activity  Outcome Measure Difficulty turning over in bed (including adjusting bedclothes, sheets and blankets)?: None Difficulty moving from lying on back to sitting on the side of the bed? : None Difficulty sitting down on and standing up from a chair with arms (e.g., wheelchair, bedside commode, etc,.)?: None Help needed moving to and from a bed to chair (including a  wheelchair)?: None Help needed walking in hospital room?: None Help needed climbing 3-5 steps with a railing? : None 6 Click Score: 24    End of Session Equipment Utilized During Treatment: Gait belt;Oxygen Activity Tolerance: Patient tolerated treatment well Patient left: in bed;with call bell/phone within reach;with bed alarm set Nurse Communication: Mobility status      Time: 1610-9604 PT Time Calculation (min) (ACUTE ONLY): 19 min   Charges:              Mickel Duhamel, SPT 08/31/2018, 2:16 PM

## 2018-09-01 ENCOUNTER — Inpatient Hospital Stay (HOSPITAL_COMMUNITY)
Admit: 2018-09-01 | Discharge: 2018-09-01 | Disposition: A | Discharge status: Home / Self Care | Payer: Medicare Other | Attending: Specialist | Admitting: Specialist

## 2018-09-01 DIAGNOSIS — J9601 Acute respiratory failure with hypoxia: Secondary | ICD-10-CM

## 2018-09-01 LAB — GLUCOSE, CAPILLARY
GLUCOSE-CAPILLARY: 153 mg/dL — AB (ref 70–99)
Glucose-Capillary: 148 mg/dL — ABNORMAL HIGH (ref 70–99)
Glucose-Capillary: 156 mg/dL — ABNORMAL HIGH (ref 70–99)
Glucose-Capillary: 140 mg/dL — ABNORMAL HIGH (ref 70–99)

## 2018-09-01 MED ORDER — BUDESONIDE 0.5 MG/2ML IN SUSP
0.5000 mg | Freq: Two times a day (BID) | RESPIRATORY_TRACT | Status: DC
  Administered 2018-09-01 – 2018-09-02 (×2): 0.5 mg via RESPIRATORY_TRACT
  Filled 2018-09-01 (×2): qty 2

## 2018-09-01 MED ORDER — LEVOFLOXACIN 500 MG PO TABS: 500.0000 mg | ORAL_TABLET | Freq: Every day | ORAL | 0 refills | Status: AC

## 2018-09-01 MED ORDER — IPRATROPIUM-ALBUTEROL 0.5-2.5 (3) MG/3ML IN SOLN
3.0000 mL | Freq: Four times a day (QID) | RESPIRATORY_TRACT | Status: DC
  Administered 2018-09-01 – 2018-09-02 (×4): 3 mL via RESPIRATORY_TRACT
  Filled 2018-09-01 (×4): qty 3

## 2018-09-01 MED ORDER — METFORMIN HCL 500 MG PO TABS: 500.0000 mg | ORAL_TABLET | Freq: Two times a day (BID) | ORAL | 1 refills | Status: DC

## 2018-09-01 MED ORDER — BLOOD GLUCOSE MONITOR KIT: PACK | 0 refills | Status: AC

## 2018-09-01 NOTE — Progress Notes (Signed)
[facility] - [place] at [facility]      PATIENT NAME: Randy Velez    MR#:  [id number]  DATE OF BIRTH:  [birth year]  SUBJECTIVE:   Patient denies any shortness of breath, chest pain.  Still was quite hypoxic just on minimal exertion.  Afebrile, hemodynamically stable.  REVIEW OF SYSTEMS:    Review of Systems  Constitutional: Negative for chills and fever.  HENT: Negative for congestion and tinnitus.   Eyes: Negative for blurred vision and double vision.  Respiratory: Negative for cough, shortness of breath and wheezing.   Cardiovascular: Negative for chest pain, orthopnea and PND.  Gastrointestinal: Negative for abdominal pain, diarrhea, nausea and vomiting.  Genitourinary: Negative for dysuria and hematuria.  Neurological: Negative for dizziness, sensory change and focal weakness.  All other systems reviewed and are negative.   Nutrition: Carb control Tolerating Diet: yes Tolerating PT: Eval noted.   DRUG ALLERGIES:   Allergies  Allergen Reactions  . Lithium   . Morphine Other (See Comments)    Short of breath  . Risperidone   . Ziprasidone Hcl Anxiety    VITALS:  Blood pressure 117/84, pulse 98, temperature 98 F (36.7 C), temperature source Oral, resp. rate 18, height 6' (1.829 m), weight 129.9 kg, SpO2 (!) 85 %.  PHYSICAL EXAMINATION:   Physical Exam  GENERAL:  52 y.o.-year-old obese patient lying in bed in no acute distress.  EYES: Pupils equal, round, reactive to light and accommodation. No scleral icterus. Extraocular muscles intact.  HEENT: Head atraumatic, normocephalic. Oropharynx and nasopharynx clear.  NECK:  Supple, no jugular venous distention. No thyroid enlargement, no tenderness.  LUNGS: Normal breath sounds bilaterally, no wheezing, rales, rhonchi. No use of accessory muscles of respiration.  CARDIOVASCULAR: S1, S2 normal. No murmurs, rubs, or gallops.  ABDOMEN: Soft, nontender, nondistended. Bowel sounds present. No  organomegaly or mass.  EXTREMITIES: No cyanosis, clubbing or edema b/l.    NEUROLOGIC: Cranial nerves II through XII are intact. No focal Motor or sensory deficits b/l.   PSYCHIATRIC: The patient is alert and oriented x 3.  SKIN: No obvious rash, lesion, or ulcer.    LABORATORY PANEL:   CBC Recent Labs  Lab 08/31/18 0600  WBC 7.8  HGB 18.8*  HCT 57.5*  PLT 141*   ------------------------------------------------------------------------------------------------------------------  Chemistries  Recent Labs  Lab 08/30/18 0637  08/31/18 0600  NA 138  --  140  K 3.9  --  4.5  CL 101  --  100  CO2 30  --  33*  GLUCOSE 234*  --  165*  BUN 10  --  16  CREATININE 0.83   < > 0.80  CALCIUM 9.2  --  8.8*  AST 71*  --   --   ALT 66*  --   --   ALKPHOS 49  --   --   BILITOT 0.6  --   --    < > = values in this interval not displayed.   ------------------------------------------------------------------------------------------------------------------  Cardiac Enzymes Recent Labs  Lab 08/30/18 0637  TROPONINI <0.03   ------------------------------------------------------------------------------------------------------------------  RADIOLOGY:  No results found.   ASSESSMENT AND PLAN:   52 year old male with past medical history of bipolar disorder, obstructive sleep apnea, insomnia who presents to the hospital after a fall and noted to be hypoxic.  1.  Acute respiratory failure with hypoxia- suspected to be secondary to underlying pneumonia. -Despite being treated for his pneumonia patient continues to be hypoxic on minimal exertion.  Patient likely  has underlying sleep apnea combined with obesity pickwickian syndrome. - Continue empiric IV antibiotics with ceftriaxone, Zithromax for pneumonia.  Patient will need outpatient sleep study and CPAP. - I will start him on some scheduled duo nebs, Pulmicort nebs, will also get echocardiogram. -Also started on incentive spirometer,  consider getting overnight oximetry.   2.  Pneumonia- due to CAP. - cont. IV Ceftriaxone, Zithromax.  Cultures (-) so far.   3.  Hyperglycemia-patient had a random blood sugar greater than 200.  - A1c is 7.3. New onset DM. Appreciate DM coordinator input. And cont. Metformin.  - will given glucometer upon discharge.   4.  History of bipolar disorder- continue Abilify, Seroquel, doxepin.  All the records are reviewed and case discussed with Care Management/Social Worker. Management plans discussed with the patient, family and they are in agreement.  CODE STATUS: Full code  DVT Prophylaxis: Lovenox  TOTAL TIME TAKING CARE OF THIS PATIENT: 30 minutes.   POSSIBLE D/C IN 1-2 DAYS, DEPENDING ON CLINICAL CONDITION.   Houston Siren M.D on 09/01/2018 at 2:00 PM  Between 7am to 6pm - Pager - [phone]  After 6pm go to [email or web address] - Social research officer, government  [facility] [place] [facility]  Office  [phone]  CC: Primary care physician; Patient, No Pcp Per

## 2018-09-01 NOTE — Plan of Care (Signed)
  RD consulted for nutrition education regarding diabetes.   Lab Results  Component Value Date   HGBA1C 7.3 (H) 08/30/2018   52 year old male with PMHx of bipolar disorder, insomnia who was admitted after a fall found to have hypoxia related to PNA and possible underlying sleep apnea combined with obesity pickwickian syndrome, also noted to have hyperglycemia now with new diagnosis of DM.  Met with patient, his sister, and his nephew at bedside. Patient reports he has a very good appetite and intake at baseline and here. He typically eats 2 meals per day plus snacks. For breakfast he has cereal in whole milk. For lunch he has a meat with vegetables or a sandwich. He drinks whole milk and buttermilk constantly throughout the day and reports likely drinking 1/2 gallon each day.   RD provided "Carbohydrate Counting for People with Diabetes" handout from the Academy of Nutrition and Dietetics. Discussed different food groups and their effects on blood sugar, emphasizing carbohydrate-containing foods. Provided list of carbohydrates and recommended serving sizes of common foods.  RD provided "Using Nutrition Labels: Carbohydrates" handout from the Academy of Nutrition and Dietetics. Discussed how to read a nutrition label including looking at serving size and servings per container. Reviewed that there are 15 grams of carbohydrate in one carbohydrate choice. Encouraged patient to use chart for range of carbohydrate grams per choice when reading nutrition labels.  Discussed importance of controlled and consistent carbohydrate intake throughout the day. Provided examples of ways to balance meals/snacks and encouraged intake of high-fiber, whole grain complex carbohydrates. Teach back method used.  Expect fair compliance. Patient would benefit from outpatient counseling from RD. Noted the order has already been placed for outpatient counseling.  Body mass index is 38.84 kg/m. Pt meets criteria for obesity  class II based on current BMI.  Current diet order is carbohydrate modified, patient is consuming approximately 100% of meals at this time. Labs and medications reviewed. No further nutrition interventions warranted at this time. RD contact information provided. If additional nutrition issues arise, please re-consult RD.  Willey Blade, MS, [place], LDN Office: [phone] Pager: [phone] After Hours/Weekend Pager: [phone]

## 2018-09-01 NOTE — Care Management (Addendum)
Patient presented to ED after sustaining a fall at home.  Found to have oxygen sats in the 80's. He has qualified for continuous oxygen but there is no underlying chronic cardiopulmonary condition documented.  Patient is currently being treated for acute respiratoy failure due to pneumonia.  Discussed that would have to pay privately for the oxygen.  Patient has had a cpap in the home but "it did not work and I did not like it.. I took  it back. "  Says this was greater than 2 years ago. He has followed with [facility] but do not see diagnosis other than "hypoxia"  and OSA.  CM discussed that he would need to schedule another sleep study in order to obtain another cpap and adequately treat his OSA. Discussed long term adverse heath issues caused by untreated OSA. Patient appears to minimize this issue.  He has insurance but no financial resources to pay out of pocket for oxygen..  Discussed pulmonary toilet, possible pulmonary consult and overnight oximetry with attending.  Discharge will be held for today in hope of improving oxygen sats.  Has been given incentive spirometer. CM did not get the chance to address whether patient has pcp.

## 2018-09-01 NOTE — Progress Notes (Addendum)
SATURATION QUALIFICATIONS: (This note is used to comply with regulatory documentation for home oxygen)  Patient Saturations on Room Air at Rest = 89  Patient Saturations on Room Air while Ambulating = 85  Patient Saturations on 3 Liters of oxygen while Ambulating = 93%   

## 2018-09-01 NOTE — Progress Notes (Signed)
*  PRELIMINARY RESULTS* Echocardiogram 2D Echocardiogram has been performed.  [name] 09/01/2018, 5:28 PM

## 2018-09-02 LAB — ECHOCARDIOGRAM COMPLETE
Height: 72 in
WEIGHTICAEL: 4582.04 [oz_av]

## 2018-09-02 LAB — GLUCOSE, CAPILLARY
GLUCOSE-CAPILLARY: 133 mg/dL — AB (ref 70–99)
Glucose-Capillary: 173 mg/dL — ABNORMAL HIGH (ref 70–99)
Glucose-Capillary: 159 mg/dL — ABNORMAL HIGH (ref 70–99)

## 2018-09-02 NOTE — Plan of Care (Signed)
  Problem: Education: Goal: Knowledge of General Education information will improve Description Including pain rating scale, medication(s)/side effects and non-pharmacologic comfort measures Outcome: Progressing   Problem: Health Behavior/Discharge Planning: Goal: Ability to manage health-related needs will improve Outcome: Progressing   Problem: Clinical Measurements: Goal: Ability to maintain clinical measurements within normal limits will improve Outcome: Progressing   Problem: Education: Goal: Ability to describe self-care measures that may prevent or decrease complications (Diabetes Survival Skills Education) will improve Outcome: Progressing   Problem: Coping: Goal: Ability to adjust to condition or change in health will improve Outcome: Progressing   Problem: Metabolic: Goal: Ability to maintain appropriate glucose levels will improve Outcome: Progressing

## 2018-09-02 NOTE — Care Management Important Message (Signed)
Important Message  Patient Details  Name: Randy Velez MRN: [id number] Date of Birth: 1966-10-19   Medicare Important Message Given:  Yes    Olegario Messier A Lashay Osborne 09/02/2018, 11:33 AM

## 2018-09-02 NOTE — Care Management (Signed)
Qualifies for noturnal oxygen. Will arrange follow-up appointment with Dr. Marcelino Duster. Dr, Cherlynn Kaiser updated Gwenette Greet RN MSN CCM Care Management [phone]

## 2018-09-02 NOTE — Progress Notes (Signed)
Patient discharged with sister, IV removed. Verbalized understanding of education, patient with no complaints. Patient planned to discharge home with nocturnal oxygen at home per case management.

## 2018-09-02 NOTE — Discharge Summary (Signed)
[facility] NAME: Randy Velez    MR#:  [id number]  DATE OF BIRTH:  [birth year]  DATE OF ADMISSION:  08/30/2018 ADMITTING PHYSICIAN: Henreitta Leber, MD  DATE OF DISCHARGE: 09/02/2018  PRIMARY CARE PHYSICIAN: Patient, No Pcp Per    ADMISSION DIAGNOSIS:  Acute respiratory failure with hypoxia ([place]) [J96.01] Community acquired pneumonia, unspecified laterality [J18.9]  DISCHARGE DIAGNOSIS:  Active Problems:   Acute respiratory failure with hypoxia ([place])   SECONDARY DIAGNOSIS:   Past Medical History:  Diagnosis Date  . Bipolar disorder ([place])   . Insomnia   . Sleep apnea     HOSPITAL COURSE:   52 year old male with past medical history of bipolar disorder, obstructive sleep apnea, insomnia who presents to the hospital after a fall and noted to be hypoxic.  1. 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 - Patient was treated with IV antibiotics for his pneumonia and now being discharged on oral Levaquin. - Patient did qualify for home oxygen but does not have a chronic diagnosis to be on continuous oxygen.  He had overnight oximetry done which was evident for hypoxemia and hypoxia. - Patient needs to see a pulmonologist and get full PFTs and also outpatient sleep study done as he likely has underlying sleep apnea.  At this time patient is being discharged on oxygen at bedtime.   2. Pneumonia-  patient was treated with IV ceftriaxone, Zithromax and now being discharged on oral Levaquin.  3. Hyperglycemia-patient had a random blood sugar greater than 200.  - A1c was 7.3. New onset DM. Appreciate DM coordinator input. pt. Now being discharged on Oral Metformin.  - pt. Was also given script for Glucometer, test strip and lancets.   4. History of bipolar disorder- pt. will continue Abilify, Seroquel,  doxepin.  She needs to get himself a PCP and is being referred to [facility]:   Stable.   CONSULTS OBTAINED:    DRUG ALLERGIES:   Allergies  Allergen Reactions  . Lithium   . Morphine Other (See Comments)    Short of breath  . Risperidone   . Ziprasidone Hcl Anxiety    DISCHARGE MEDICATIONS:   Allergies as of 09/02/2018      Reactions   Lithium    Morphine Other (See Comments)   Short of breath   Risperidone    Ziprasidone Hcl Anxiety      Medication List    STOP taking these medications   HYDROcodone-acetaminophen 5-325 MG tablet Commonly known as:  NORCO/VICODIN     TAKE these medications   ARIPiprazole 20 MG tablet Commonly known as:  ABILIFY Take 20 mg by mouth at bedtime.   blood glucose meter kit and supplies Kit Dispense based on patient and insurance preference. Use up to four times daily as directed. (FOR ICD-9 250.00, 250.01).   doxepin 100 MG capsule Commonly known as:  SINEQUAN Take 300 mg by mouth at bedtime.   levofloxacin 500 MG tablet Commonly known as:  LEVAQUIN Take 1 tablet (500 mg total) by mouth daily for 5 days.   metFORMIN 500 MG tablet Commonly known as:  GLUCOPHAGE Take 1 tablet (500 mg total) by mouth 2 (two) times daily with a meal.   QUEtiapine 400 MG tablet Commonly known as:  SEROQUEL Take 800 mg by mouth at bedtime.  Durable Medical Equipment  (From admission, onward)         Start     Ordered   09/02/18 1425  For home use only DME oxygen  Once    Question Answer Comment  Mode or (Route) Nasal cannula   Liters per Minute 2   Frequency Only at night (stationary unit needed)   Oxygen conserving device Yes   Oxygen delivery system Gas      09/02/18 1424            DISCHARGE INSTRUCTIONS:   DIET:  Diabetic diet  DISCHARGE CONDITION:  Stable  ACTIVITY:  Activity as tolerated  OXYGEN:  Home Oxygen: Yes.     Oxygen Delivery: 2 liters/min via Patient  connected to nasal cannula oxygen  DISCHARGE LOCATION:  home   If you experience worsening of your admission symptoms, develop shortness of breath, life threatening emergency, suicidal or homicidal thoughts you must seek medical attention immediately by calling 911 or calling your MD immediately  if symptoms less severe.  You Must read complete instructions/literature along with all the possible adverse reactions/side effects for all the Medicines you take and that have been prescribed to you. Take any new Medicines after you have completely understood and accpet all the possible adverse reactions/side effects.   Please note  You were cared for by a hospitalist during your hospital stay. If you have any questions about your discharge medications or the care you received while you were in the hospital after you are discharged, you can call the unit and asked to speak with the hospitalist on call if the hospitalist that took care of you is not available. Once you are discharged, your primary care physician will handle any further medical issues. Please note that NO REFILLS for any discharge medications will be authorized once you are discharged, as it is imperative that you return to your primary care physician (or establish a relationship with a primary care physician if you do not have one) for your aftercare needs so that they can reassess your need for medications and monitor your lab values.     Today   No acute issues overnight, will be arranged for home O2 at bedtime. Will d/c home today.   VITAL SIGNS:  Blood pressure 119/82, pulse 85, temperature 98.1 F (36.7 C), temperature source Oral, resp. rate 16, height 6' (1.829 m), weight 129.9 kg, SpO2 90 %.  I/O:    Intake/Output Summary (Last 24 hours) at 09/02/2018 1528 Last data filed at 09/02/2018 1409 Gross per 24 hour  Intake 600 ml  Output -  Net 600 ml    PHYSICAL EXAMINATION:   GENERAL:  52 y.o.-year-old obese patient  lying in bed in no acute distress.  EYES: Pupils equal, round, reactive to light and accommodation. No scleral icterus. Extraocular muscles intact.  HEENT: Head atraumatic, normocephalic. Oropharynx and nasopharynx clear.  NECK:  Supple, no jugular venous distention. No thyroid enlargement, no tenderness.  LUNGS: Normal breath sounds bilaterally, no wheezing, rales, rhonchi. No use of accessory muscles of respiration.  CARDIOVASCULAR: S1, S2 normal. No murmurs, rubs, or gallops.  ABDOMEN: Soft, nontender, nondistended. Bowel sounds present. No organomegaly or mass.  EXTREMITIES: No cyanosis, clubbing or edema b/l.    NEUROLOGIC: Cranial nerves II through XII are intact. No focal Motor or sensory deficits b/l.   PSYCHIATRIC: The patient is alert and oriented x 3.  SKIN: No obvious rash, lesion, or ulcer.   DATA REVIEW:   CBC Recent  Labs  Lab 08/31/18 0600  WBC 7.8  HGB 18.8*  HCT 57.5*  PLT 141*    Chemistries  Recent Labs  Lab 08/30/18 0637  08/31/18 0600  NA 138  --  140  K 3.9  --  4.5  CL 101  --  100  CO2 30  --  33*  GLUCOSE 234*  --  165*  BUN 10  --  16  CREATININE 0.83   < > 0.80  CALCIUM 9.2  --  8.8*  AST 71*  --   --   ALT 66*  --   --   ALKPHOS 49  --   --   BILITOT 0.6  --   --    < > = values in this interval not displayed.    Cardiac Enzymes Recent Labs  Lab 08/30/18 [place] <0.03    Microbiology Results  Results for orders placed or performed during the hospital encounter of 08/30/18  Blood culture (routine x 2)     Status: None (Preliminary result)   Collection Time: 08/30/18  8:59 AM  Result Value Ref Range Status   Specimen Description BLOOD RIGHT HAND  Final   Special Requests   Final    BOTTLES DRAWN AEROBIC AND ANAEROBIC Blood Culture adequate volume   Culture   Final    NO GROWTH 3 DAYS Performed at [facility], [address] [place], [place] [address]    Report Status PENDING  Incomplete  Blood culture  (routine x 2)     Status: None (Preliminary result)   Collection Time: 08/30/18  8:59 AM  Result Value Ref Range Status   Specimen Description BLOOD RIGHT [facility]  Final   Special Requests   Final    BOTTLES DRAWN AEROBIC AND ANAEROBIC Blood Culture adequate volume   Culture   Final    NO GROWTH 3 DAYS Performed at [facility], [address], [place], [place] [address]    Report Status PENDING  Incomplete    RADIOLOGY:  No results found.    Management plans discussed with the patient, family and they are in agreement.  CODE STATUS:     Code Status Orders  (From admission, onward)         Start     Ordered   08/30/18 1026  Full code  Continuous     08/30/18 1025        Code Status History    This patient has a current code status but no historical code status.      TOTAL TIME TAKING CARE OF THIS PATIENT: 40 minutes.    Henreitta Leber M.D on 09/02/2018 at 3:28 PM  Between 7am to 6pm - Pager - [phone]  After 6pm go to [email or web address] - Proofreader  [facility]  Office  [phone]  CC: Primary care physician; Patient, No Pcp Per

## 2018-09-04 LAB — CULTURE, BLOOD (ROUTINE X 2)
CULTURE: NO GROWTH
Culture: NO GROWTH
SPECIAL REQUESTS: ADEQUATE
Special Requests: ADEQUATE

## 2019-03-22 DIAGNOSIS — F331 Major depressive disorder, recurrent, moderate: Secondary | ICD-10-CM | POA: Diagnosis not present

## 2019-07-04 DIAGNOSIS — F609 Personality disorder, unspecified: Secondary | ICD-10-CM | POA: Diagnosis not present

## 2019-07-04 DIAGNOSIS — Z9049 Acquired absence of other specified parts of digestive tract: Secondary | ICD-10-CM | POA: Diagnosis not present

## 2019-07-04 DIAGNOSIS — E119 Type 2 diabetes mellitus without complications: Secondary | ICD-10-CM | POA: Diagnosis not present

## 2019-07-04 DIAGNOSIS — F319 Bipolar disorder, unspecified: Secondary | ICD-10-CM | POA: Diagnosis not present

## 2019-07-04 DIAGNOSIS — R1011 Right upper quadrant pain: Secondary | ICD-10-CM | POA: Diagnosis not present

## 2019-07-04 DIAGNOSIS — Z87891 Personal history of nicotine dependence: Secondary | ICD-10-CM | POA: Diagnosis not present

## 2019-07-04 DIAGNOSIS — Z885 Allergy status to narcotic agent status: Secondary | ICD-10-CM | POA: Diagnosis not present

## 2019-07-04 DIAGNOSIS — Z888 Allergy status to other drugs, medicaments and biological substances status: Secondary | ICD-10-CM | POA: Diagnosis not present

## 2019-08-22 DIAGNOSIS — F331 Major depressive disorder, recurrent, moderate: Secondary | ICD-10-CM | POA: Diagnosis not present

## 2019-10-28 DIAGNOSIS — F331 Major depressive disorder, recurrent, moderate: Secondary | ICD-10-CM | POA: Diagnosis not present

## 2019-11-27 ENCOUNTER — Emergency Department: Payer: Medicare Other

## 2019-11-27 ENCOUNTER — Ambulatory Visit (INDEPENDENT_AMBULATORY_CARE_PROVIDER_SITE_OTHER)
Admission: EM | Admit: 2019-11-27 | Discharge: 2019-11-27 | Disposition: A | Discharge status: Other Acute Inpatient Hospital | Payer: Medicare Other | Source: Home / Self Care | Attending: Family Medicine | Admitting: Family Medicine

## 2019-11-27 ENCOUNTER — Other Ambulatory Visit: Payer: Self-pay

## 2019-11-27 ENCOUNTER — Inpatient Hospital Stay
Admission: EM | Admit: 2019-11-27 | Discharge: 2019-12-01 | DRG: 189 | Disposition: A | Discharge status: Home Health | Payer: Medicare Other | Attending: Internal Medicine | Admitting: Internal Medicine

## 2019-11-27 ENCOUNTER — Encounter: Payer: Self-pay | Admitting: Emergency Medicine

## 2019-11-27 DIAGNOSIS — S92321A Displaced fracture of second metatarsal bone, right foot, initial encounter for closed fracture: Secondary | ICD-10-CM

## 2019-11-27 DIAGNOSIS — R600 Localized edema: Secondary | ICD-10-CM | POA: Diagnosis not present

## 2019-11-27 DIAGNOSIS — Z6837 Body mass index (BMI) 37.0-37.9, adult: Secondary | ICD-10-CM

## 2019-11-27 DIAGNOSIS — S92324A Nondisplaced fracture of second metatarsal bone, right foot, initial encounter for closed fracture: Secondary | ICD-10-CM | POA: Diagnosis not present

## 2019-11-27 DIAGNOSIS — E119 Type 2 diabetes mellitus without complications: Secondary | ICD-10-CM

## 2019-11-27 DIAGNOSIS — Z20822 Contact with and (suspected) exposure to covid-19: Secondary | ICD-10-CM | POA: Diagnosis not present

## 2019-11-27 DIAGNOSIS — Z7984 Long term (current) use of oral hypoglycemic drugs: Secondary | ICD-10-CM

## 2019-11-27 DIAGNOSIS — J9601 Acute respiratory failure with hypoxia: Secondary | ICD-10-CM

## 2019-11-27 DIAGNOSIS — R945 Abnormal results of liver function studies: Secondary | ICD-10-CM | POA: Diagnosis not present

## 2019-11-27 DIAGNOSIS — E662 Morbid (severe) obesity with alveolar hypoventilation: Secondary | ICD-10-CM | POA: Diagnosis present

## 2019-11-27 DIAGNOSIS — Z888 Allergy status to other drugs, medicaments and biological substances status: Secondary | ICD-10-CM

## 2019-11-27 DIAGNOSIS — F319 Bipolar disorder, unspecified: Secondary | ICD-10-CM | POA: Diagnosis present

## 2019-11-27 DIAGNOSIS — Z79899 Other long term (current) drug therapy: Secondary | ICD-10-CM

## 2019-11-27 DIAGNOSIS — G4700 Insomnia, unspecified: Secondary | ICD-10-CM | POA: Diagnosis present

## 2019-11-27 DIAGNOSIS — Z9181 History of falling: Secondary | ICD-10-CM

## 2019-11-27 DIAGNOSIS — R7989 Other specified abnormal findings of blood chemistry: Secondary | ICD-10-CM | POA: Diagnosis present

## 2019-11-27 DIAGNOSIS — Z885 Allergy status to narcotic agent status: Secondary | ICD-10-CM

## 2019-11-27 DIAGNOSIS — G8929 Other chronic pain: Secondary | ICD-10-CM | POA: Diagnosis present

## 2019-11-27 DIAGNOSIS — R0902 Hypoxemia: Secondary | ICD-10-CM | POA: Diagnosis not present

## 2019-11-27 DIAGNOSIS — R160 Hepatomegaly, not elsewhere classified: Secondary | ICD-10-CM | POA: Diagnosis present

## 2019-11-27 DIAGNOSIS — X58XXXA Exposure to other specified factors, initial encounter: Secondary | ICD-10-CM | POA: Diagnosis present

## 2019-11-27 DIAGNOSIS — J9811 Atelectasis: Secondary | ICD-10-CM | POA: Diagnosis present

## 2019-11-27 DIAGNOSIS — E872 Acidosis: Secondary | ICD-10-CM | POA: Diagnosis present

## 2019-11-27 DIAGNOSIS — M25571 Pain in right ankle and joints of right foot: Secondary | ICD-10-CM | POA: Diagnosis not present

## 2019-11-27 DIAGNOSIS — R0602 Shortness of breath: Secondary | ICD-10-CM

## 2019-11-27 DIAGNOSIS — G4730 Sleep apnea, unspecified: Secondary | ICD-10-CM | POA: Diagnosis not present

## 2019-11-27 DIAGNOSIS — Z87891 Personal history of nicotine dependence: Secondary | ICD-10-CM

## 2019-11-27 DIAGNOSIS — M7989 Other specified soft tissue disorders: Secondary | ICD-10-CM | POA: Diagnosis not present

## 2019-11-27 LAB — CBC WITH DIFFERENTIAL/PLATELET
Abs Immature Granulocytes: 0.02 10*3/uL (ref 0.00–0.07)
Basophils Absolute: 0.1 10*3/uL (ref 0.0–0.1)
Basophils Relative: 1 %
Eosinophils Absolute: 0.1 10*3/uL (ref 0.0–0.5)
Eosinophils Relative: 2 %
HCT: 57.5 % — ABNORMAL HIGH (ref 39.0–52.0)
Hemoglobin: 19.4 g/dL — ABNORMAL HIGH (ref 13.0–17.0)
Immature Granulocytes: 0 %
Lymphocytes Relative: 26 %
Lymphs Abs: 2.2 10*3/uL (ref 0.7–4.0)
MCH: 29.6 pg (ref 26.0–34.0)
MCHC: 33.7 g/dL (ref 30.0–36.0)
MCV: 87.8 fL (ref 80.0–100.0)
Monocytes Absolute: 0.5 10*3/uL (ref 0.1–1.0)
Monocytes Relative: 7 %
Neutro Abs: 5.4 10*3/uL (ref 1.7–7.7)
Neutrophils Relative %: 64 %
Platelets: 121 10*3/uL — ABNORMAL LOW (ref 150–400)
RBC: 6.55 MIL/uL — ABNORMAL HIGH (ref 4.22–5.81)
RDW: 14.2 % (ref 11.5–15.5)
WBC: 8.3 10*3/uL (ref 4.0–10.5)
nRBC: 0 % (ref 0.0–0.2)

## 2019-11-27 LAB — HEMOGLOBIN A1C
Hgb A1c MFr Bld: 9.7 % — ABNORMAL HIGH (ref 4.8–5.6)
Mean Plasma Glucose: 231.69 mg/dL

## 2019-11-27 LAB — COMPREHENSIVE METABOLIC PANEL
ALT: 91 U/L — ABNORMAL HIGH (ref 0–44)
AST: 125 U/L — ABNORMAL HIGH (ref 15–41)
Albumin: 3.8 g/dL (ref 3.5–5.0)
Alkaline Phosphatase: 76 U/L (ref 38–126)
Anion gap: 8 (ref 5–15)
BUN: 12 mg/dL (ref 6–20)
CO2: 28 mmol/L (ref 22–32)
Calcium: 9.2 mg/dL (ref 8.9–10.3)
Chloride: 100 mmol/L (ref 98–111)
Creatinine, Ser: 0.72 mg/dL (ref 0.61–1.24)
GFR calc Af Amer: >60 mL/min (ref >60)
GFR calc non Af Amer: >60 mL/min (ref >60)
Glucose, Bld: 265 mg/dL — ABNORMAL HIGH (ref 70–99)
Potassium: 4.8 mmol/L (ref 3.5–5.1)
Sodium: 136 mmol/L (ref 135–145)
Total Bilirubin: 0.8 mg/dL (ref 0.3–1.2)
Total Protein: 7.6 g/dL (ref 6.5–8.1)

## 2019-11-27 LAB — HEPATITIS PANEL, ACUTE
HCV Ab: NONREACTIVE
Hep A IgM: NONREACTIVE
Hep B C IgM: NONREACTIVE
Hepatitis B Surface Ag: NONREACTIVE

## 2019-11-27 LAB — BRAIN NATRIURETIC PEPTIDE: B Natriuretic Peptide: 18 pg/mL (ref 0.0–100.0)

## 2019-11-27 LAB — HIV ANTIBODY (ROUTINE TESTING W REFLEX): HIV Screen 4th Generation wRfx: NONREACTIVE

## 2019-11-27 LAB — TROPONIN I (HIGH SENSITIVITY)
Troponin I (High Sensitivity): 5 ng/L (ref <18)
Troponin I (High Sensitivity): 5 ng/L (ref <18)

## 2019-11-27 LAB — GLUCOSE, CAPILLARY
Glucose-Capillary: 153 mg/dL — ABNORMAL HIGH (ref 70–99)
Glucose-Capillary: 253 mg/dL — ABNORMAL HIGH (ref 70–99)

## 2019-11-27 LAB — POC SARS CORONAVIRUS 2 AG: SARS Coronavirus 2 Ag: NEGATIVE

## 2019-11-27 MED ORDER — OXYCODONE HCL 5 MG PO TABS
5.0000 mg | ORAL_TABLET | ORAL | Status: DC | PRN
  Administered 2019-11-27 – 2019-11-28 (×2): 5 mg via ORAL
  Filled 2019-11-27 (×2): qty 1

## 2019-11-27 MED ORDER — ENOXAPARIN SODIUM 40 MG/0.4ML ~~LOC~~ SOLN
40.0000 mg | SUBCUTANEOUS | Status: DC
  Administered 2019-11-27 – 2019-11-30 (×4): 40 mg via SUBCUTANEOUS
  Filled 2019-11-27 (×4): qty 0.4

## 2019-11-27 MED ORDER — ACETAMINOPHEN 325 MG PO TABS: 650.0000 mg | ORAL_TABLET | Freq: Four times a day (QID) | ORAL | Status: DC | PRN

## 2019-11-27 MED ORDER — ONDANSETRON HCL 4 MG/2ML IJ SOLN
4.0000 mg | Freq: Three times a day (TID) | INTRAMUSCULAR | Status: DC | PRN
  Administered 2019-11-27: 4 mg via INTRAVENOUS
  Filled 2019-11-27: qty 2

## 2019-11-27 MED ORDER — ARIPIPRAZOLE 10 MG PO TABS
20.0000 mg | ORAL_TABLET | Freq: Every day | ORAL | Status: DC
  Administered 2019-11-27 – 2019-11-30 (×4): 20 mg via ORAL
  Filled 2019-11-27 (×7): qty 2

## 2019-11-27 MED ORDER — INSULIN ASPART 100 UNIT/ML ~~LOC~~ SOLN
0.0000 [IU] | Freq: Every day | SUBCUTANEOUS | Status: DC
  Administered 2019-11-27 – 2019-11-29 (×2): 3 [IU] via SUBCUTANEOUS
  Administered 2019-11-30: 21:00:00 2 [IU] via SUBCUTANEOUS
  Filled 2019-11-27 (×3): qty 1

## 2019-11-27 MED ORDER — QUETIAPINE FUMARATE 300 MG PO TABS
800.0000 mg | ORAL_TABLET | Freq: Every day | ORAL | Status: DC
  Administered 2019-11-27 – 2019-11-28 (×2): 800 mg via ORAL
  Filled 2019-11-27 (×3): qty 1

## 2019-11-27 MED ORDER — DM-GUAIFENESIN ER 30-600 MG PO TB12
1.0000 | ORAL_TABLET | Freq: Two times a day (BID) | ORAL | Status: DC
  Administered 2019-11-27 – 2019-11-28 (×4): 1 via ORAL
  Filled 2019-11-27 (×4): qty 1

## 2019-11-27 MED ORDER — HYDROCODONE-ACETAMINOPHEN 5-325 MG PO TABS: 1.0000 | ORAL_TABLET | ORAL | Status: DC | PRN

## 2019-11-27 MED ORDER — HYDROCODONE-ACETAMINOPHEN 5-325 MG PO TABS
1.0000 | ORAL_TABLET | Freq: Once | ORAL | Status: AC
  Administered 2019-11-27: 1 via ORAL
  Filled 2019-11-27: qty 1

## 2019-11-27 MED ORDER — IPRATROPIUM BROMIDE HFA 17 MCG/ACT IN AERS: 2.0000 | INHALATION_SPRAY | RESPIRATORY_TRACT | Status: DC

## 2019-11-27 MED ORDER — IPRATROPIUM BROMIDE 0.02 % IN SOLN
0.5000 mg | RESPIRATORY_TRACT | Status: DC
  Administered 2019-11-27 (×2): 0.5 mg via RESPIRATORY_TRACT
  Filled 2019-11-27 (×3): qty 2.5

## 2019-11-27 MED ORDER — ALBUTEROL SULFATE HFA 108 (90 BASE) MCG/ACT IN AERS: 2.0000 | INHALATION_SPRAY | RESPIRATORY_TRACT | Status: DC | PRN

## 2019-11-27 MED ORDER — ALBUTEROL SULFATE (2.5 MG/3ML) 0.083% IN NEBU
2.5000 mg | INHALATION_SOLUTION | RESPIRATORY_TRACT | Status: DC | PRN
  Administered 2019-11-27: 2.5 mg via RESPIRATORY_TRACT
  Filled 2019-11-27: qty 3

## 2019-11-27 MED ORDER — IOHEXOL 350 MG/ML SOLN
100.0000 mL | Freq: Once | INTRAVENOUS | Status: AC | PRN
  Administered 2019-11-27: 100 mL via INTRAVENOUS

## 2019-11-27 MED ORDER — INSULIN ASPART 100 UNIT/ML ~~LOC~~ SOLN
0.0000 [IU] | Freq: Three times a day (TID) | SUBCUTANEOUS | Status: DC
  Administered 2019-11-27: 2 [IU] via SUBCUTANEOUS
  Administered 2019-11-28 – 2019-11-29 (×4): 3 [IU] via SUBCUTANEOUS
  Administered 2019-11-29 (×2): 2 [IU] via SUBCUTANEOUS
  Administered 2019-11-30 (×2): 5 [IU] via SUBCUTANEOUS
  Administered 2019-11-30 – 2019-12-01 (×2): 3 [IU] via SUBCUTANEOUS
  Administered 2019-12-01: 2 [IU] via SUBCUTANEOUS
  Filled 2019-11-27 (×11): qty 1

## 2019-11-27 MED ORDER — DOXEPIN HCL 100 MG PO CAPS
300.0000 mg | ORAL_CAPSULE | Freq: Every day | ORAL | Status: DC
  Administered 2019-11-27 – 2019-11-30 (×4): 300 mg via ORAL
  Filled 2019-11-27 (×7): qty 3

## 2019-11-27 NOTE — ED Notes (Signed)
Pt began feeling nauseous. Zofran ordered and administered.

## 2019-11-27 NOTE — Progress Notes (Signed)
Patient on o2 6 liters via nasal cannula. On airborne isolation/contact precautions. Patient has refused cpap and states does not have one at home.

## 2019-11-27 NOTE — ED Notes (Signed)
Pt sat 89% on 2L. Increased flow rate to 3L. O2 sat increased to 92%

## 2019-11-27 NOTE — ED Triage Notes (Signed)
Pt presents with c/o swollen and painful right foot. He states it has been increasing in pain and edema for about a month. He does have pain with ambulation and movement. He denies any known injuries. He denies any history of gout.

## 2019-11-27 NOTE — ED Notes (Addendum)
This RN to room. Pt O2 sat 88-89% on 4L. Increased flow rate to 6L. Pt sat 91% on 6L.

## 2019-11-27 NOTE — ED Provider Notes (Signed)
[facility]    CSN: [id number] Arrival date & time: 11/27/19  1005      History   Chief Complaint Chief Complaint  Patient presents with  . Foot Pain    right, with swelling   HPI  54 year old male presents with the above complaint.  Patient presents with right foot pain.  Patient states that this has been going on for the past month.  No fall, trauma, injury.  Rates his pain as 10/10 in severity.  Located on the dorsum of his right foot.  Upon triage, patient found to be hypoxic and satting 86% on room air.  Patient was subsequently placed on 2 L of nasal cannula.  Denies cough.  Denies shortness of breath.  Denies chest pain.  No other complaints at this time.  Of note, last year in October he was found to be hypoxic after suffering a fall and was subsequently admitted for acute respiratory failure and pneumonia.  PMH, Surgical Hx, Family Hx, Social History reviewed and updated as below.  Past Medical History:  Diagnosis Date  . Bipolar disorder ([place])   . Insomnia   . Sleep apnea   Obesity hypoventilation syndrome  Patient Active Problem List   Diagnosis Date Noted  . Acute respiratory failure with hypoxia ([place]) 08/30/2018    Past Surgical History:  Procedure Laterality Date  . CHOLECYSTECTOMY     Home Medications    Prior to Admission medications   Medication Sig Start Date End Date Taking? Authorizing Provider  ARIPiprazole (ABILIFY) 20 MG tablet Take 20 mg by mouth at bedtime.    Yes [provider]  blood glucose meter kit and supplies KIT Dispense based on patient and insurance preference. Use up to four times daily as directed. (FOR ICD-9 250.00, 250.01). 09/01/18  Yes Sainani, Belia Heman, MD  doxepin (SINEQUAN) 100 MG capsule Take 300 mg by mouth at bedtime.    Yes [provider]  QUEtiapine (SEROQUEL) 400 MG tablet Take 800 mg by mouth at bedtime.    Yes [provider]  metFORMIN (GLUCOPHAGE) 500 MG tablet Take 1 tablet  (500 mg total) by mouth 2 (two) times daily with a meal. 09/01/18 10/31/18  Henreitta Leber, MD    Family History Family History  Problem Relation Age of Onset  . Breast cancer Mother     Social History Social History   Tobacco Use  . Smoking status: Former Smoker    Packs/day: 1.00    Years: 2.00    Pack years: 2.00    Types: Cigarettes  . Smokeless tobacco: Former Network engineer Use Topics  . Alcohol use: Not Currently  . Drug use: Not Currently     Allergies   Lithium, Morphine, Risperidone, and Ziprasidone hcl   Review of Systems Review of Systems  Constitutional: Negative.   Respiratory: Negative.   Cardiovascular: Negative.   Musculoskeletal:       Foot pain.   Physical Exam Triage Vital Signs ED Triage Vitals  Enc Vitals Group     BP 11/27/19 1020 115/81     Pulse Rate 11/27/19 1020 99     Resp --      Temp 11/27/19 1020 98.7 F (37.1 C)     Temp Source 11/27/19 1020 Oral     SpO2 11/27/19 1020 (!) 86 %     Weight 11/27/19 1018 280 lb (127 kg)     Height 11/27/19 1018 6' (1.829 m)     Head Circumference --  Peak Flow --      Pain Score 11/27/19 1017 10     Pain Loc --      Pain Edu? --      Excl. in [place]? --    Updated Vital Signs BP 115/81 (BP Location: Left Arm)   Pulse 99   Temp 98.7 F (37.1 C) (Oral)   Ht 6' (1.829 m)   Wt 127 kg   SpO2 92%   BMI 37.97 kg/m   Visual Acuity Right Eye Distance:   Left Eye Distance:   Bilateral Distance:    Right Eye Near:   Left Eye Near:    Bilateral Near:     Physical Exam Constitutional:      General: He is not in acute distress.    Appearance: Normal appearance. He is obese. He is not ill-appearing.  HENT:     Head: Normocephalic and atraumatic.  Eyes:     General:        Right eye: No discharge.        Left eye: No discharge.     Conjunctiva/sclera: Conjunctivae normal.  Cardiovascular:     Rate and Rhythm: Normal rate and regular rhythm.     Heart sounds: No murmur.    Pulmonary:     Effort: Pulmonary effort is normal.     Breath sounds: Normal breath sounds. No wheezing, rhonchi or rales.  Musculoskeletal:     Comments: Right foot -tenderness to palpation on the dorsum of the right foot.  Mild swelling.    Neurological:     Mental Status: He is alert.  Psychiatric:        Mood and Affect: Mood normal.        Behavior: Behavior normal.     UC Treatments / Results  Labs (all labs ordered are listed, but only abnormal results are displayed) Labs Reviewed - No data to display  EKG   Radiology No results found.  Procedures Procedures (including critical care time)  Medications Ordered in UC Medications - No data to display  Initial Impression / Assessment and Plan / UC Course  I have reviewed the triage vital signs and the nursing notes.  Pertinent labs & imaging results that were available during my care of the patient were reviewed by me and considered in my medical decision making (see chart for details).    54 year old male presents with hypoxia.  Placed on nasal cannula oxygen.  Given oxygen requirement, patient is going to the hospital via EMS.  [facility] ED charge nurse informed.  Final Clinical Impressions(s) / UC Diagnoses   Final diagnoses:  Acute respiratory failure with hypoxia [facility])   Discharge Instructions   None    ED Prescriptions    None     PDMP not reviewed this encounter.   Coral Spikes, [place] 11/27/19 1119

## 2019-11-27 NOTE — ED Triage Notes (Signed)
Pt via EMS from [facility]. Pt c/o R foot pain, pt states it been hurting for about a month and is swollen and throbbing. While at Urgent Care, pt's O2 sat was in 80s on RA. Placed on 2L. On arrival, pt is sat 88% on RA. Pt sat 89-91% on 2L. Pt denies SOB and CP.

## 2019-11-27 NOTE — ED Notes (Signed)
Pt presents to ED from [facility]. Pt states he was trying to get seen for his R foot. He says that its been hurting for almost a month. On assessment, pt's foot is red and swollen. Pt describes as a throbbing pain. While at Urgent care pt O2 sat was in the high 80s on RA. On arrival, pt O2 on RA is 87-89%. Pt placed on 2L and O2 sat increased to 93-94%. Pt denies cough and SOB.

## 2019-11-27 NOTE — ED Provider Notes (Signed)
[facility] Emergency Department Provider Note    First MD Initiated Contact with Patient 11/27/19 1138     (approximate)  I have reviewed the triage vital signs and the nursing notes.   HISTORY  Chief Complaint Foot Pain    HPI Ko Bardon Hogan is a 54 y.o. male the below listed past medical history presents the ER for med in urgent care due to being found to be hypoxic on room air.  Patient is being checked in and wanted to be evaluated for 1 month of right foot pain and swelling.  Does have a remote history of injury to that foot.  Denies any history of heart failure or COPD.  States he does not smoke.  Denies any fevers or congestion.  On triage patient was found to be hypoxic down to 87%.  Did require supplemental oxygen.  He denies any active chest pain or pressure.  No nausea or vomiting.    Past Medical History:  Diagnosis Date  . Bipolar disorder ([place])   . Insomnia   . Sleep apnea    Family History  Problem Relation Age of Onset  . Breast cancer Mother    Past Surgical History:  Procedure Laterality Date  . CHOLECYSTECTOMY     Patient Active Problem List   Diagnosis Date Noted  . Closed fracture of second metatarsal bone of right foot 11/27/2019  . Bipolar disorder ([place])   . Sleep apnea   . Abnormal LFTs   . Acute respiratory failure with hypoxia ([place]) 08/30/2018      Prior to Admission medications   Medication Sig Start Date End Date Taking? Authorizing Provider  ARIPiprazole (ABILIFY) 20 MG tablet Take 20 mg by mouth at bedtime.     [provider]  blood glucose meter kit and supplies KIT Dispense based on patient and insurance preference. Use up to four times daily as directed. (FOR ICD-9 250.00, 250.01). 09/01/18   Henreitta Leber, MD  doxepin (SINEQUAN) 100 MG capsule Take 300 mg by mouth at bedtime.     [provider]  metFORMIN (GLUCOPHAGE) 500 MG tablet Take 1 tablet (500 mg total) by mouth 2 (two) times  daily with a meal. 09/01/18 10/31/18  Henreitta Leber, MD  QUEtiapine (SEROQUEL) 400 MG tablet Take 800 mg by mouth at bedtime.     [provider]    Allergies Lithium, Morphine, Risperidone, and Ziprasidone hcl    Social History Social History   Tobacco Use  . Smoking status: Former Smoker    Packs/day: 1.00    Years: 2.00    Pack years: 2.00    Types: Cigarettes  . Smokeless tobacco: Former Network engineer Use Topics  . Alcohol use: Not Currently  . Drug use: Not Currently    Review of Systems Patient denies headaches, rhinorrhea, blurry vision, numbness, shortness of breath, chest pain, edema, cough, abdominal pain, nausea, vomiting, diarrhea, dysuria, fevers, rashes or hallucinations unless otherwise stated above in HPI. ____________________________________________   PHYSICAL EXAM:  VITAL SIGNS: Vitals:   11/27/19 1142 11/27/19 1200  BP: (!) 125/96 (!) 125/99  Pulse: 94 92  Resp: 16 20  Temp: 98.1 F (36.7 C)   SpO2: 92% (!) 87%    Constitutional: Alert and oriented.  Eyes: Conjunctivae are normal.  Head: Atraumatic. Nose: No congestion/rhinnorhea. Mouth/Throat: Mucous membranes are moist.   Neck: No stridor. Painless ROM.  Cardiovascular: Normal rate, regular rhythm. Grossly normal heart sounds.  Good peripheral circulation.  Respiratory: Normal respiratory effort.  No retractions. Lungs CTAB. Gastrointestinal: Soft and nontender. No distention. No abdominal bruits. No CVA tenderness. Genitourinary:  Musculoskeletal: right foot ttp and doral swelling, no ovelying warmthno edema.  No joint effusions. Neurologic:  Normal speech and language. No gross focal neurologic deficits are appreciated. No facial droop Skin:  Skin is warm, dry and intact. No rash noted. Psychiatric: Mood and affect are normal. Speech and behavior are normal.  ____________________________________________   LABS (all labs ordered are listed, but only abnormal results are  displayed)  Results for orders placed or performed during the hospital encounter of 11/27/19 (from the past 24 hour(s))  CBC with Differential     Status: Abnormal   Collection Time: 11/27/19 11:43 AM  Result Value Ref Range   WBC 8.3 4.0 - 10.5 K/uL   RBC 6.55 (H) 4.22 - 5.81 MIL/uL   Hemoglobin 19.4 (H) 13.0 - 17.0 g/dL   HCT 57.5 (H) 39.0 - 52.0 %   MCV 87.8 80.0 - 100.0 fL   MCH 29.6 26.0 - 34.0 pg   MCHC 33.7 30.0 - 36.0 g/dL   RDW 14.2 11.5 - 15.5 %   Platelets 121 (L) 150 - 400 K/uL   nRBC 0.0 0.0 - 0.2 %   Neutrophils Relative % 64 %   Neutro Abs 5.4 1.7 - 7.7 K/uL   Lymphocytes Relative 26 %   Lymphs Abs 2.2 0.7 - 4.0 K/uL   Monocytes Relative 7 %   Monocytes Absolute 0.5 0.1 - 1.0 K/uL   Eosinophils Relative 2 %   Eosinophils Absolute 0.1 0.0 - 0.5 K/uL   Basophils Relative 1 %   Basophils Absolute 0.1 0.0 - 0.1 K/uL   Immature Granulocytes 0 %   Abs Immature Granulocytes 0.02 0.00 - 0.07 K/uL  Comprehensive metabolic panel     Status: Abnormal   Collection Time: 11/27/19 11:43 AM  Result Value Ref Range   Sodium 136 135 - 145 mmol/L   Potassium 4.8 3.5 - 5.1 mmol/L   Chloride 100 98 - 111 mmol/L   CO2 28 22 - 32 mmol/L   Glucose, Bld 265 (H) 70 - 99 mg/dL   BUN 12 6 - 20 mg/dL   Creatinine, Ser 0.72 0.61 - 1.24 mg/dL   Calcium 9.2 8.9 - 10.3 mg/dL   Total Protein 7.6 6.5 - 8.1 g/dL   Albumin 3.8 3.5 - 5.0 g/dL   AST 125 (H) 15 - 41 U/L   ALT 91 (H) 0 - 44 U/L   Alkaline Phosphatase 76 38 - 126 U/L   Total Bilirubin 0.8 0.3 - 1.2 mg/dL   GFR calc non Af Amer >60 >60 mL/min   GFR calc Af Amer >60 >60 mL/min   Anion gap 8 5 - 15  Troponin I (High Sensitivity)     Status: None   Collection Time: 11/27/19 11:43 AM  Result Value Ref Range   Troponin I (High Sensitivity) 5 <18 ng/L  Brain natriuretic peptide     Status: None   Collection Time: 11/27/19 11:43 AM  Result Value Ref Range   B Natriuretic Peptide 18.0 0.0 - 100.0 pg/mL  POC SARS Coronavirus 2 Ag      Status: None   Collection Time: 11/27/19  1:20 PM  Result Value Ref Range   SARS Coronavirus 2 Ag NEGATIVE NEGATIVE   ____________________________________________  EKG My review and personal interpretation at Time: 12:00   Indication: hypoxia  Rate: 90  Rhythm: sinus Axis:  left Other: poor r wave progression, no stemi ____________________________________________  RADIOLOGY  I personally reviewed all radiographic images ordered to evaluate for the above acute complaints and reviewed radiology reports and findings.  These findings were personally discussed with the patient.  Please see medical record for radiology report.  ____________________________________________   PROCEDURES  Procedure(s) performed:  .Critical Care Performed by: Merlyn Lot, MD Authorized by: Merlyn Lot, MD   Critical care provider statement:    Critical care time was exclusive of:  Separately billable procedures and treating other patients   Critical care was time spent personally by me on the following activities:  Development of treatment plan with patient or surrogate, discussions with consultants, evaluation of patient's response to treatment, examination of patient, obtaining history from patient or surrogate, ordering and performing treatments and interventions, ordering and review of laboratory studies, ordering and review of radiographic studies, pulse oximetry, re-evaluation of patient's condition and review of old charts      Critical Care performed: yes ____________________________________________   INITIAL IMPRESSION / ASSESSMENT AND PLAN / ED COURSE  Pertinent labs & imaging results that were available during my care of the patient were reviewed by me and considered in my medical decision making (see chart for details).   DDX: Asthma, copd, CHF, pna, ptx, malignancy, Pe, anemia   Wendal R Sou is a 54 y.o. who presents to the ED with acute respiratory failure with  hypoxia.  No signs of pneumonia or heart failure.  Patient relatively symptomatic.  Blood work and imaging will be sent for the but differential to evaluate for PE.  The patient will be placed on continuous pulse oximetry and telemetry for monitoring.  Laboratory evaluation will be sent to evaluate for the above complaints.     Clinical Course as of Nov 27 1447  Sun Nov 27, 2019  1352 Patient reassessed.  Remains hypoxic requiring supplemental oxygen.  No evidence of PE.  Have a lower suspicion for infection at this time.  Possible pulmonary hypertension as he does have a history of sleep apnea.  However at this point given his persistent hypoxia will require hospitalization.  Will discuss with hospitalist.   [PR]    Clinical Course User Index [PR] Merlyn Lot, MD    The patient was evaluated in Emergency Department today for the symptoms described in the history of present illness. He/she was evaluated in the context of the global COVID-19 pandemic, which necessitated consideration that the patient might be at risk for infection with the SARS-CoV-2 virus that causes COVID-19. Institutional protocols and algorithms that pertain to the evaluation of patients at risk for COVID-19 are in a state of rapid change based on information released by regulatory bodies including the CDC and federal and state organizations. These policies and algorithms were followed during the patient's care in the ED.  As part of my medical decision making, I reviewed the following data within the [place] notes reviewed and incorporated, Labs reviewed, notes from prior ED visits and [place] Controlled Substance Database   ____________________________________________   FINAL CLINICAL IMPRESSION(S) / ED DIAGNOSES  Final diagnoses:  Acute respiratory failure with hypoxia (HCC)  Closed fracture of second metatarsal bone of right foot, physeal involvement unspecified, initial encounter       NEW MEDICATIONS STARTED DURING THIS VISIT:  New Prescriptions   No medications on file     Note:  This document was prepared using Dragon voice recognition software and may include unintentional dictation errors.  Merlyn Lot, MD 11/27/19 [phone]

## 2019-11-27 NOTE — H&P (Signed)
History and Physical    Randy Velez:[id number] DOB: [birth year] DOA: 11/27/2019  Referring MD/NP/PA:   PCP: Patient, No Pcp Per   Patient coming from:  The patient is coming from home.  At baseline, pt is independent for most of ADL.        Chief Complaint: right foot pain  HPI: Randy Velez is a 54 y.o. male with medical history significant of diabetes mellitus, OSA, bipolar, insomnia, who presents with right foot pain.  Patient states that he has been having right foot pain right foot swelling for more than 1 month, denies any injury. His pain is constant, sharp, 10 out of 10 severity, nonradiating.  Patient denies any shortness of breath, cough or chest pain, but he was found to have oxygen desaturation to 86% on room air in ED. No fever or chills.  No nausea, vomiting, diarrhea, abdominal pain, symptoms of UTI or unilateral weakness.  Patient states that he used to use CPAP, but stopped using CPAP approximately 2 years ago.  ED Course: pt was found to have WBC 8.3, troponin V, BNP 18, negative Covid Ag test, electrolytes renal function okay, abnormal liver function (ALP 76, AST 125, ALT 91, total bilirubin 0.8), temperature normal, blood pressure 125/99, heart rate in 90s, RR 20, chest x-ray negative.  CT angiogram is negative for PE.  Right lower extremity Doppler is negative for DVT.   X-ray of right foot showed mildly comminuted fracture of the second metatarsal shaft with some callus formation suggesting subacute injury.  Dr.Cline of podiatry was consulted. Pt is placed on med-surg bed for obs.   Review of Systems:   General: no fevers, chills, no body weight gain, has fatigue HEENT: no blurry vision, hearing changes or sore throat Respiratory: no dyspnea, coughing, wheezing CV: no chest pain, no palpitations GI: no nausea, vomiting, abdominal pain, diarrhea, constipation GU: no dysuria, burning on urination, increased urinary frequency, hematuria  Ext: has right foot pain  and swelling Neuro: no unilateral weakness, numbness, or tingling, no vision change or hearing loss Skin: no rash, no skin tear. MSK: No muscle spasm, no deformity, no limitation of range of movement in spin Heme: No easy bruising.  Travel history: No recent long distant travel.  Allergy:  Allergies  Allergen Reactions  . Lithium   . Morphine Other (See Comments)    Short of breath  . Risperidone   . Ziprasidone Hcl Anxiety    Past Medical History:  Diagnosis Date  . Bipolar disorder ([place])   . Insomnia   . Sleep apnea     Past Surgical History:  Procedure Laterality Date  . CHOLECYSTECTOMY      Social History:  reports that he has quit smoking. His smoking use included cigarettes. He has a 2.00 pack-year smoking history. He has quit using smokeless tobacco. He reports previous alcohol use. He reports previous drug use.  Family History:  Family History  Problem Relation Age of Onset  . Breast cancer Mother      Prior to Admission medications   Medication Sig Start Date End Date Taking? Authorizing Provider  ARIPiprazole (ABILIFY) 20 MG tablet Take 20 mg by mouth at bedtime.     [provider]  blood glucose meter kit and supplies KIT Dispense based on patient and insurance preference. Use up to four times daily as directed. (FOR ICD-9 250.00, 250.01). 09/01/18   Henreitta Leber, MD  doxepin (SINEQUAN) 100 MG capsule Take 300 mg by mouth at  bedtime.     [provider]  metFORMIN (GLUCOPHAGE) 500 MG tablet Take 1 tablet (500 mg total) by mouth 2 (two) times daily with a meal. 09/01/18 10/31/18  Henreitta Leber, MD  QUEtiapine (SEROQUEL) 400 MG tablet Take 800 mg by mouth at bedtime.     [provider]    Physical Exam: Vitals:   11/27/19 1142 11/27/19 1144 11/27/19 1200  BP: (!) 125/96  (!) 125/99  Pulse: 94  92  Resp: 16  20  Temp: 98.1 F (36.7 C)    TempSrc: Oral    SpO2: 92%  (!) 87%  Weight:  127 kg   Height:  6' (1.829 m)     General: Not in acute distress HEENT:       Eyes: PERRL, EOMI, no scleral icterus.       ENT: No discharge from the ears and nose, no pharynx injection, no tonsillar enlargement.        Neck: No JVD, no bruit, no mass felt. Heme: No neck lymph node enlargement. Cardiac: S1/S2, RRR, No murmurs, No gallops or rubs. Respiratory: No rales, wheezing, rhonchi or rubs. GI: Soft, nondistended, nontender, no rebound pain, no organomegaly, BS present. GU: No hematuria Ext: 1+DP/PT pulse bilaterally. Has swelling and tenderness in the right foot dorsal area. No ovelying warmth. Musculoskeletal: No joint deformities, No joint redness or warmth, no limitation of ROM in spin. Skin: No rashes.  Neuro: Alert, oriented X3, cranial nerves II-XII grossly intact, moves all extremities normally. Psych: Patient is not psychotic, no suicidal or hemocidal ideation.  Labs on Admission: I have personally reviewed following labs and imaging studies  CBC: Recent Labs  Lab 11/27/19 1143  WBC 8.3  NEUTROABS 5.4  HGB 19.4*  HCT 57.5*  MCV 87.8  PLT 765*   Basic Metabolic Panel: Recent Labs  Lab 11/27/19 1143  NA 136  K 4.8  CL 100  CO2 28  GLUCOSE 265*  BUN 12  CREATININE 0.72  CALCIUM 9.2   GFR: Estimated Creatinine Clearance: 145.4 mL/min (by C-G formula based on SCr of 0.72 mg/dL). Liver Function Tests: Recent Labs  Lab 11/27/19 1143  AST 125*  ALT 91*  ALKPHOS 76  BILITOT 0.8  PROT 7.6  ALBUMIN 3.8   No results for input(s): LIPASE, AMYLASE in the last 168 hours. No results for input(s): AMMONIA in the last 168 hours. Coagulation Profile: No results for input(s): INR, PROTIME in the last 168 hours. Cardiac Enzymes: No results for input(s): CKTOTAL, CKMB, CKMBINDEX, TROPONINI in the last 168 hours. BNP (last 3 results) No results for input(s): PROBNP in the last 8760 hours. HbA1C: No results for input(s): HGBA1C in the last 72 hours. CBG: No results for input(s): GLUCAP in  the last 168 hours. Lipid Profile: No results for input(s): CHOL, HDL, LDLCALC, TRIG, CHOLHDL, LDLDIRECT in the last 72 hours. Thyroid Function Tests: No results for input(s): TSH, T4TOTAL, FREET4, T3FREE, THYROIDAB in the last 72 hours. Anemia Panel: No results for input(s): VITAMINB12, FOLATE, FERRITIN, TIBC, IRON, RETICCTPCT in the last 72 hours. Urine analysis:    Component Value Date/Time   COLORURINE Yellow 12/31/2011 0005   COLORURINE AMBER BIOCHEMICALS MAY BE AFFECTED BY COLOR (A) 09/28/2007 1814   APPEARANCEUR Hazy 12/31/2011 0005   LABSPEC 1.016 12/31/2011 0005   PHURINE 5.0 12/31/2011 0005   PHURINE 6.0 09/28/2007 1814   GLUCOSEU 50 mg/dL 12/31/2011 0005   HGBUR Negative 12/31/2011 0005   HGBUR NEGATIVE 09/28/2007 1814   BILIRUBINUR  Negative 12/31/2011 0005   KETONESUR 1+ 12/31/2011 0005   KETONESUR TRACE (A) 09/28/2007 1814   PROTEINUR Negative 12/31/2011 0005   PROTEINUR 30 (A) 09/28/2007 1814   UROBILINOGEN 0.2 09/28/2007 1814   NITRITE Negative 12/31/2011 0005   NITRITE NEGATIVE 09/28/2007 1814   LEUKOCYTESUR Trace 12/31/2011 0005   Sepsis Labs: @LABRCNTIP (procalcitonin:4,lacticidven:4) )No results found for this or any previous visit (from the past 240 hour(s)).   Radiological Exams on Admission: CT Angio Chest PE W and/or Wo Contrast  Result Date: 11/27/2019 CLINICAL DATA:  Shortness of breath, history of small cell lung carcinoma with hypoxia EXAM: CT ANGIOGRAPHY CHEST WITH CONTRAST TECHNIQUE: Multidetector CT imaging of the chest was performed using the standard protocol during bolus administration of intravenous contrast. Multiplanar CT image reconstructions and MIPs were obtained to evaluate the vascular anatomy. CONTRAST:  171m OMNIPAQUE IOHEXOL 350 MG/ML SOLN COMPARISON:  08/30/2018 FINDINGS: Cardiovascular: Thoracic aorta demonstrates a normal branching pattern without aneurysmal dilatation or dissection. No cardiac enlargement is seen. No pericardial  effusion is noted. No significant coronary calcifications are seen. The pulmonary artery shows a normal branching pattern. No definitive filling defects are identified to suggest pulmonary emboli. Mediastinum/Nodes: Thoracic inlet is within normal limits. No hilar or mediastinal adenopathy is noted. The esophagus as visualized is within normal limits. Lungs/Pleura: The lungs are well aerated bilaterally. Minimal bibasilar atelectatic changes are noted left slightly greater than right. No focal confluent infiltrate or sizable effusion is seen. No parenchymal nodules or recurrent mass is noted. Upper Abdomen: Visualized upper abdomen demonstrates mild fatty infiltration of the liver. The gallbladder has been surgically removed. Musculoskeletal: Degenerative changes of the thoracic spine are seen. No acute bony abnormality is noted. Review of the MIP images confirms the above findings. IMPRESSION: No evidence of pulmonary emboli. Mild bibasilar atelectatic changes. Fatty liver. Electronically Signed   By: MInez CatalinaM.D.   On: 11/27/2019 13:15   UKoreaVenous Img Lower Unilateral Right  Result Date: 11/27/2019 CLINICAL DATA:  Right lower extremity swelling and edema for 1 month EXAM: RIGHT LOWER EXTREMITY VENOUS DOPPLER ULTRASOUND TECHNIQUE: Gray-scale sonography with graded compression, as well as color Doppler and duplex ultrasound were performed to evaluate the lower extremity deep venous systems from the level of the common femoral vein and including the common femoral, femoral, profunda femoral, popliteal and calf veins including the posterior tibial, peroneal and gastrocnemius veins when visible. The superficial great saphenous vein was also interrogated. Spectral Doppler was utilized to evaluate flow at rest and with distal augmentation maneuvers in the common femoral, femoral and popliteal veins. COMPARISON:  None. FINDINGS: Contralateral Common Femoral Vein: Respiratory phasicity is normal and symmetric with  the symptomatic side. No evidence of thrombus. Normal compressibility. Common Femoral Vein: No evidence of thrombus. Normal compressibility, respiratory phasicity and response to augmentation. Saphenofemoral Junction: No evidence of thrombus. Normal compressibility and flow on color Doppler imaging. Profunda Femoral Vein: No evidence of thrombus. Normal compressibility and flow on color Doppler imaging. Femoral Vein: No evidence of thrombus. Normal compressibility, respiratory phasicity and response to augmentation. Popliteal Vein: No evidence of thrombus. Normal compressibility, respiratory phasicity and response to augmentation. Calf Veins: No evidence of thrombus. Normal compressibility and flow on color Doppler imaging. IMPRESSION: No evidence of deep venous thrombosis. Electronically Signed   By: MJerilynn Mages  Shick M.D.   On: 11/27/2019 14:09   DG Chest Portable 1 View  Result Date: 11/27/2019 CLINICAL DATA:  Short of breath and cough. EXAM: PORTABLE CHEST 1 VIEW COMPARISON:  08/30/2018 FINDINGS: Normal heart size. Short decreased lung volumes. No pleural effusion or edema. No airspace opacities identified. IMPRESSION: No active disease. Electronically Signed   By: Kerby Moors M.D.   On: 11/27/2019 12:15   DG Foot Complete Right  Result Date: 11/27/2019 CLINICAL DATA:  No new injury, pain, swelling and redness top of right foot. Previous fx right mid foot about 5 yrs ago. EXAM: RIGHT FOOT COMPLETE - 3+ VIEW COMPARISON:  None. FINDINGS: There is a mildly comminuted fracture in the mid shaft of the second metatarsal. There is some callus formation suggesting subacute injury. No other acute bony abnormality in the right foot. There is dorsal soft tissue swelling. IMPRESSION: Mildly comminuted fracture of the second metatarsal shaft with some callus formation suggesting subacute injury. Electronically Signed   By: Audie Pinto M.D.   On: 11/27/2019 12:21     EKG: Independently reviewed.  Sinus rhythm, QTC  467, low voltage, LAD, poor R wave progression  Assessment/Plan Principal Problem:   Acute respiratory failure with hypoxia (HCC) Active Problems:   Closed fracture of second metatarsal bone of right foot   Bipolar disorder (HCC)   Sleep apnea   Abnormal LFTs   Diabetes mellitus without complication (HCC)   Acute respiratory failure with hypoxia ([place]): Etiology is not clear.  Chest x-ray negative.  COVID-19 antigen negative.  No fever, low suspicions for COVID-19 infection.  CT angiogram is negative for PE.  BNP 18,  does not seem to have acute CHF.  Patient has history of OSA, which may have contributed partially.  Will get 2D echo for further evaluation to rule out other possibilities, such as pulmonary hypertension.  -will place on med-surg bed for obs -CPAP -Bronchodilators -Nasal cannula oxygen to make the oxygen saturation above 93% -will get 2D echo   Closed fracture of second metatarsal bone of right foot: Dr. Cleda Mccreedy of podiatry was consulted --> "Pt. will need a splint and crutches for non wt. [place]".  -Dr. Quentin Cornwall of ED will kindly help to put patient on splint and crutch -prn oxycodone for pain  Bipolar disorder [facility]): -continue home Abilify, doxepin, Seroquel  Diabetes mellitus without complication: Last M6Q 7.3, poorly controled. Patient is taking Metformin at home -SSI  Sleep apnea: -CPAP  Abnormal LFTs: -check hepatitis panel and HIV antibody -Avoid using Tylenol    DVT ppx: SQ Lovenox Code Status: Full code Family Communication: None at bed side.    Disposition Plan:  Anticipate discharge back to previous home environment Consults called:  Dr. Cleda Mccreedy of podiatry Admission status: Med-surg bed for obs  Date of Service 11/27/2019    [facility]   If 7PM-7AM, please contact night-coverage [email or web address] Password [email or web address] 11/27/2019, 5:15 PM

## 2019-11-28 ENCOUNTER — Encounter: Payer: Self-pay | Admitting: Internal Medicine

## 2019-11-28 ENCOUNTER — Observation Stay: Payer: Medicare Other

## 2019-11-28 ENCOUNTER — Observation Stay (HOSPITAL_BASED_OUTPATIENT_CLINIC_OR_DEPARTMENT_OTHER)
Admit: 2019-11-28 | Discharge: 2019-11-28 | Disposition: A | Discharge status: Home / Self Care | Payer: Medicare Other | Attending: Internal Medicine | Admitting: Internal Medicine

## 2019-11-28 DIAGNOSIS — J9811 Atelectasis: Secondary | ICD-10-CM | POA: Diagnosis present

## 2019-11-28 DIAGNOSIS — E119 Type 2 diabetes mellitus without complications: Secondary | ICD-10-CM | POA: Diagnosis present

## 2019-11-28 DIAGNOSIS — F319 Bipolar disorder, unspecified: Secondary | ICD-10-CM | POA: Diagnosis present

## 2019-11-28 DIAGNOSIS — I5031 Acute diastolic (congestive) heart failure: Secondary | ICD-10-CM | POA: Diagnosis not present

## 2019-11-28 DIAGNOSIS — E872 Acidosis: Secondary | ICD-10-CM | POA: Diagnosis present

## 2019-11-28 DIAGNOSIS — E662 Morbid (severe) obesity with alveolar hypoventilation: Secondary | ICD-10-CM | POA: Diagnosis present

## 2019-11-28 DIAGNOSIS — Z20822 Contact with and (suspected) exposure to covid-19: Secondary | ICD-10-CM | POA: Diagnosis present

## 2019-11-28 DIAGNOSIS — R160 Hepatomegaly, not elsewhere classified: Secondary | ICD-10-CM | POA: Diagnosis present

## 2019-11-28 DIAGNOSIS — Z7984 Long term (current) use of oral hypoglycemic drugs: Secondary | ICD-10-CM | POA: Diagnosis not present

## 2019-11-28 DIAGNOSIS — G8929 Other chronic pain: Secondary | ICD-10-CM | POA: Diagnosis present

## 2019-11-28 DIAGNOSIS — Z6837 Body mass index (BMI) 37.0-37.9, adult: Secondary | ICD-10-CM | POA: Diagnosis not present

## 2019-11-28 DIAGNOSIS — Z888 Allergy status to other drugs, medicaments and biological substances status: Secondary | ICD-10-CM | POA: Diagnosis not present

## 2019-11-28 DIAGNOSIS — Z885 Allergy status to narcotic agent status: Secondary | ICD-10-CM | POA: Diagnosis not present

## 2019-11-28 DIAGNOSIS — G4700 Insomnia, unspecified: Secondary | ICD-10-CM | POA: Diagnosis present

## 2019-11-28 DIAGNOSIS — Z9181 History of falling: Secondary | ICD-10-CM | POA: Diagnosis not present

## 2019-11-28 DIAGNOSIS — R0602 Shortness of breath: Secondary | ICD-10-CM | POA: Diagnosis not present

## 2019-11-28 DIAGNOSIS — X58XXXA Exposure to other specified factors, initial encounter: Secondary | ICD-10-CM | POA: Diagnosis present

## 2019-11-28 DIAGNOSIS — J9601 Acute respiratory failure with hypoxia: Secondary | ICD-10-CM | POA: Diagnosis not present

## 2019-11-28 DIAGNOSIS — Z79899 Other long term (current) drug therapy: Secondary | ICD-10-CM | POA: Diagnosis not present

## 2019-11-28 DIAGNOSIS — Z87891 Personal history of nicotine dependence: Secondary | ICD-10-CM | POA: Diagnosis not present

## 2019-11-28 DIAGNOSIS — S92321A Displaced fracture of second metatarsal bone, right foot, initial encounter for closed fracture: Secondary | ICD-10-CM | POA: Diagnosis present

## 2019-11-28 LAB — CBC
HCT: 56.8 % — ABNORMAL HIGH (ref 39.0–52.0)
Hemoglobin: 18.9 g/dL — ABNORMAL HIGH (ref 13.0–17.0)
MCH: 29.9 pg (ref 26.0–34.0)
MCHC: 33.3 g/dL (ref 30.0–36.0)
MCV: 89.9 fL (ref 80.0–100.0)
Platelets: 112 10*3/uL — ABNORMAL LOW (ref 150–400)
RBC: 6.32 MIL/uL — ABNORMAL HIGH (ref 4.22–5.81)
RDW: 14.6 % (ref 11.5–15.5)
WBC: 10.3 10*3/uL (ref 4.0–10.5)
nRBC: 0 % (ref 0.0–0.2)

## 2019-11-28 LAB — C-REACTIVE PROTEIN: CRP: 2.3 mg/dL — ABNORMAL HIGH (ref <1.0)

## 2019-11-28 LAB — BASIC METABOLIC PANEL
Anion gap: 9 (ref 5–15)
BUN: 17 mg/dL (ref 6–20)
CO2: 29 mmol/L (ref 22–32)
Calcium: 8.8 mg/dL — ABNORMAL LOW (ref 8.9–10.3)
Chloride: 98 mmol/L (ref 98–111)
Creatinine, Ser: 0.76 mg/dL (ref 0.61–1.24)
GFR calc Af Amer: >60 mL/min (ref >60)
GFR calc non Af Amer: >60 mL/min (ref >60)
Glucose, Bld: 258 mg/dL — ABNORMAL HIGH (ref 70–99)
Potassium: 4.9 mmol/L (ref 3.5–5.1)
Sodium: 136 mmol/L (ref 135–145)

## 2019-11-28 LAB — GLUCOSE, CAPILLARY
Glucose-Capillary: 220 mg/dL — ABNORMAL HIGH (ref 70–99)
Glucose-Capillary: 256 mg/dL — ABNORMAL HIGH (ref 70–99)
Glucose-Capillary: 229 mg/dL — ABNORMAL HIGH (ref 70–99)
Glucose-Capillary: 231 mg/dL — ABNORMAL HIGH (ref 70–99)
Glucose-Capillary: 195 mg/dL — ABNORMAL HIGH (ref 70–99)

## 2019-11-28 LAB — PROCALCITONIN: Procalcitonin: 0.16 ng/mL

## 2019-11-28 LAB — ECHOCARDIOGRAM COMPLETE
Height: 72 in
Weight: 4480 oz

## 2019-11-28 LAB — SARS CORONAVIRUS 2 (TAT 6-24 HRS): SARS Coronavirus 2: NEGATIVE

## 2019-11-28 MED ORDER — PREDNISONE 10 MG (21) PO TBPK
10.0000 mg | ORAL_TABLET | Freq: Four times a day (QID) | ORAL | Status: DC
  Administered 2019-12-01 (×2): 10 mg via ORAL

## 2019-11-28 MED ORDER — PREDNISONE 10 MG (21) PO TBPK
10.0000 mg | ORAL_TABLET | ORAL | Status: AC
  Administered 2019-11-29: 10 mg via ORAL

## 2019-11-28 MED ORDER — PREDNISONE 10 MG (21) PO TBPK
20.0000 mg | ORAL_TABLET | Freq: Every evening | ORAL | Status: AC
  Administered 2019-11-29: 23:00:00 20 mg via ORAL

## 2019-11-28 MED ORDER — PREDNISONE 10 MG (21) PO TBPK
20.0000 mg | ORAL_TABLET | Freq: Every morning | ORAL | Status: AC
  Administered 2019-11-29: 12:00:00 20 mg via ORAL
  Filled 2019-11-28 (×2): qty 21

## 2019-11-28 MED ORDER — IOHEXOL 350 MG/ML SOLN
75.0000 mL | Freq: Once | INTRAVENOUS | Status: AC | PRN
  Administered 2019-11-28: 75 mL via INTRAVENOUS

## 2019-11-28 MED ORDER — IPRATROPIUM-ALBUTEROL 20-100 MCG/ACT IN AERS
1.0000 | INHALATION_SPRAY | RESPIRATORY_TRACT | Status: DC | PRN
  Filled 2019-11-28 (×3): qty 4

## 2019-11-28 MED ORDER — PREDNISONE 10 MG (21) PO TBPK
20.0000 mg | ORAL_TABLET | Freq: Every evening | ORAL | Status: AC
  Administered 2019-11-30: 20 mg via ORAL

## 2019-11-28 MED ORDER — PREDNISONE 10 MG (21) PO TBPK
10.0000 mg | ORAL_TABLET | Freq: Three times a day (TID) | ORAL | Status: AC
  Administered 2019-11-30 (×3): 10 mg via ORAL

## 2019-11-28 NOTE — Progress Notes (Signed)
PT Cancellation Note  Patient Details Name: Randy Velez MRN: [id number] DOB: 04/15/1966   Cancelled Treatment:    Reason Eval/Treat Not Completed: Fatigue/lethargy limiting ability to participate(Consult received and chart reviewed.  Patient sleeping soundly, notably diaphoretic.  Opens eyes briefly to touch, but unable to maintain alertness for participation with session.  Vitals checked-FSBS 256; HR, O2 and BP WFL (see flowsheets) on 6L [place].  RN informed/aware and at bedside to assess.  Will continue to follow and initiate as medically appropriate, able to participate.)   Laine Giovanetti H. Manson Passey, PT, DPT, NCS 11/28/19, 10:10 AM [phone]

## 2019-11-28 NOTE — Progress Notes (Signed)
Patient with escalating oxygen requirements up to 6L [place], refuses bipap.  Noted patient with chronic respiratory failure and non compliant with OSA treatment recommendations CTA chest secondary to escalating oxygen need and concern for PE despite negative dopplers IMPRESSION: 1. Negative for acute pulmonary embolus or aortic dissection 2. Partial consolidations within the bilateral lower lobes and right middle lobe favored to represent multifocal atelectasis over pneumonia.IMPRESSION: 1. Negative for acute pulmonary embolus or aortic dissection 2. Partial consolidations within the bilateral lower lobes and right middle lobe favored to represent multifocal atelectasis over Pneumonia.  COVID nasopharyngeal swab PCR pending Fl[employer] incentive spirometer and physical therapy ordered. Patietn needs to mobilizevalve ordered CRP, procalcitonin added to am labs

## 2019-11-28 NOTE — Progress Notes (Signed)
SpO2: 96%  O2 Flow Rate (L/min): 6L/min  SpO2: 84%  O2 Flow Rate (L/min): Room Air  SpO2: 90%  O2 Flow Rate (L/min): 2L/min  SpO2: 95%  O2 Flow Rate (L/min): 4L/min  Patient is now resting comfortably on 4L/min of oxygen.

## 2019-11-28 NOTE — Progress Notes (Signed)
Inpatient Diabetes Program Recommendations  AACE/ADA: New Consensus Statement on Inpatient Glycemic Control (2015)  Target Ranges:  Prepandial:   less than 140 mg/dL      Peak postprandial:   less than 180 mg/dL (1-2 hours)      Critically ill patients:  140 - 180 mg/dL   Lab Results  Component Value Date   GLUCAP 256 (H) 11/28/2019   HGBA1C 9.7 (H) 11/27/2019    Review of Glycemic Control Results for Randy Velez, Randy Velez (MRN [id number]) as of 11/28/2019 12:34  Ref. Range 11/27/2019 17:30 11/27/2019 21:32 11/28/2019 08:14 11/28/2019 10:00  Glucose-Capillary Latest Ref Range: 70 - 99 mg/dL 837 (H) 290 (H) 211 (H) 256 (H)   Diabetes history: DM 2 Outpatient Diabetes medications:  Metformin 500 mg bid Current orders for Inpatient glycemic control:  Novolog sensitive tid with meals and HS  Inpatient Diabetes Program Recommendations:    A1C indicates sub-optimal control of blood sugars prior to admit. Likely will need adjustment in home medications when d/c'd from hospital.   Please consider adding Lantus 20 units daily while patient is in the hospital.   Thanks  Beryl Meager, RN, BC-ADM Inpatient Diabetes Coordinator Pager [phone] (8a-5p)

## 2019-11-28 NOTE — Consult Note (Signed)
Reason for Consult: Fracture right second metatarsal. Referring Physician: Lajuana RippleNiu  Randy Velez is an 54 y.o. male.  HPI: This is a 54 year old male who presented to the emergency department yesterday with some chronic pain and swelling in his right foot over the last month.  Denies any injury.  X-rays were taken and he was diagnosed with a fracture in his second metatarsal.  Patient was placed in a splint in the ED and given crutches for nonweightbearing on the right foot.  Past Medical History:  Diagnosis Date  . Bipolar disorder (HCC)   . Insomnia   . Sleep apnea     Past Surgical History:  Procedure Laterality Date  . CHOLECYSTECTOMY      Family History  Problem Relation Age of Onset  . Breast cancer Mother     Social History:  reports that he has quit smoking. His smoking use included cigarettes. He has a 2.00 pack-year smoking history. He has quit using smokeless tobacco. He reports previous alcohol use. He reports previous drug use.  Allergies:  Allergies  Allergen Reactions  . Lithium   . Morphine Other (See Comments)    Short of breath  . Risperidone   . Ziprasidone Hcl Anxiety    Medications:  Scheduled: . ARIPiprazole  20 mg Oral QHS  . dextromethorphan-guaiFENesin  1 tablet Oral BID  . doxepin  300 mg Oral QHS  . enoxaparin (LOVENOX) injection  40 mg Subcutaneous Q24H  . insulin aspart  0-5 Units Subcutaneous QHS  . insulin aspart  0-9 Units Subcutaneous TID WC  . QUEtiapine  800 mg Oral QHS    Results for orders placed or performed during the hospital encounter of 11/27/19 (from the past 48 hour(s))  CBC with Differential     Status: Abnormal   Collection Time: 11/27/19 11:43 AM  Result Value Ref Range   WBC 8.3 4.0 - 10.5 K/uL   RBC 6.55 (H) 4.22 - 5.81 MIL/uL   Hemoglobin 19.4 (H) 13.0 - 17.0 g/dL   HCT 40.957.5 (H) 81.139.0 - 91.452.0 %   MCV 87.8 80.0 - 100.0 fL   MCH 29.6 26.0 - 34.0 pg   MCHC 33.7 30.0 - 36.0 g/dL   RDW 78.214.2 95.611.5 - 21.315.5 %   Platelets  121 (L) 150 - 400 K/uL    Comment: PLATELET CLUMPS NOTED ON SMEAR, UNABLE TO ESTIMATE   nRBC 0.0 0.0 - 0.2 %   Neutrophils Relative % 64 %   Neutro Abs 5.4 1.7 - 7.7 K/uL   Lymphocytes Relative 26 %   Lymphs Abs 2.2 0.7 - 4.0 K/uL   Monocytes Relative 7 %   Monocytes Absolute 0.5 0.1 - 1.0 K/uL   Eosinophils Relative 2 %   Eosinophils Absolute 0.1 0.0 - 0.5 K/uL   Basophils Relative 1 %   Basophils Absolute 0.1 0.0 - 0.1 K/uL   Immature Granulocytes 0 %   Abs Immature Granulocytes 0.02 0.00 - 0.07 K/uL    Comment: Performed at [facility], [address] [place], [place] [address]  Comprehensive metabolic panel     Status: Abnormal   Collection Time: 11/27/19 11:43 AM  Result Value Ref Range   Sodium 136 135 - 145 mmol/L   Potassium 4.8 3.5 - 5.1 mmol/L   Chloride 100 98 - 111 mmol/L   CO2 28 22 - 32 mmol/L   Glucose, Bld 265 (H) 70 - 99 mg/dL   BUN 12 6 - 20 mg/dL   Creatinine, Ser  0.72 0.61 - 1.24 mg/dL   Calcium 9.2 8.9 - 10.3 mg/dL   Total Protein 7.6 6.5 - 8.1 g/dL   Albumin 3.8 3.5 - 5.0 g/dL   AST 125 (H) 15 - 41 U/L   ALT 91 (H) 0 - 44 U/L   Alkaline Phosphatase 76 38 - 126 U/L   Total Bilirubin 0.8 0.3 - 1.2 mg/dL   GFR calc non Af Amer >60 >60 mL/min   GFR calc Af Amer >60 >60 mL/min   Anion gap 8 5 - 15    Comment: Performed at [facility], [place], [place] [address]  Troponin I (High Sensitivity)     Status: None   Collection Time: 11/27/19 11:43 AM  Result Value Ref Range   Troponin I (High Sensitivity) 5 <18 ng/L    Comment: (NOTE) Elevated high sensitivity troponin I (hsTnI) values and significant  changes across serial measurements may suggest ACS but many other  chronic and acute conditions are known to elevate hsTnI results.  Refer to the "Links" section for chest pain algorithms and additional  guidance. Performed at [facility], [address] [place], [place] [address]   Brain natriuretic peptide      Status: None   Collection Time: 11/27/19 11:43 AM  Result Value Ref Range   B Natriuretic Peptide 18.0 0.0 - 100.0 pg/mL    Comment: Performed at [facility], [address] [place], [place] [address]  POC SARS Coronavirus 2 Ag     Status: None   Collection Time: 11/27/19  1:20 PM  Result Value Ref Range   SARS Coronavirus 2 Ag NEGATIVE NEGATIVE    Comment: (NOTE) SARS-CoV-2 antigen NOT DETECTED.  Negative results are presumptive.  Negative results do not preclude SARS-CoV-2 infection and should not be used as the sole basis for treatment or other patient management decisions, including infection  control decisions, particularly in the presence of clinical signs and  symptoms consistent with COVID-19, or in those who have been in contact with the virus.  Negative results must be combined with clinical observations, patient history, and epidemiological information. The expected result is Negative. Fact Sheet for Patients: PodPark.tn Fact Sheet for Healthcare Providers: GiftContent.is This test is not yet approved or cleared by the [place] FDA and  has been authorized for detection and/or diagnosis of SARS-CoV-2 by FDA under an Emergency Use Authorization (EUA).  This EUA will remain in effect (meaning this test can be used) for the duration of  the COVID-19 de claration under Section 564(b)(1) of the Act, 21 U.S.C. section 360bbb-3(b)(1), unless the authorization is terminated or revoked sooner.   Troponin I (High Sensitivity)     Status: None   Collection Time: 11/27/19  3:17 PM  Result Value Ref Range   Troponin I (High Sensitivity) 5 <18 ng/L    Comment: (NOTE) Elevated high sensitivity troponin I (hsTnI) values and significant  changes across serial measurements may suggest ACS but many other  chronic and acute conditions are known to elevate hsTnI results.  Refer to the "Links" section for chest  pain algorithms and additional  guidance. Performed at [facility], [address] [place], [place] [address]   Hemoglobin A1c     Status: Abnormal   Collection Time: 11/27/19  3:17 PM  Result Value Ref Range   Hgb A1c MFr Bld 9.7 (H) 4.8 - 5.6 %    Comment: (NOTE) Pre diabetes:  5.7%-6.4% Diabetes:              >6.4% Glycemic control for   <7.0% adults with diabetes    Mean Plasma Glucose 231.69 mg/dL    Comment: Performed at [facility], [address] [place], [place] [address]  Hepatitis panel - Today     Status: None   Collection Time: 11/27/19  3:17 PM  Result Value Ref Range   Hepatitis B Surface Ag NON REACTIVE NON REACTIVE   HCV Ab NON REACTIVE NON REACTIVE    Comment: (NOTE) Nonreactive HCV antibody screen is consistent with no HCV infections,  unless recent infection is suspected or other evidence exists to indicate HCV infection.    Hep A IgM NON REACTIVE NON REACTIVE   Hep B C IgM NON REACTIVE NON REACTIVE    Comment: Performed at [facility], [address] [place], [place] [address]  HIV Antibody (routine testing w rflx)     Status: None   Collection Time: 11/27/19  3:17 PM  Result Value Ref Range   HIV Screen 4th Generation wRfx NON REACTIVE NON REACTIVE    Comment: Performed at [facility], [address], [place], [place] [address]  Glucose, capillary     Status: Abnormal   Collection Time: 11/27/19  5:30 PM  Result Value Ref Range   Glucose-Capillary 153 (H) 70 - 99 mg/dL  Glucose, capillary     Status: Abnormal   Collection Time: 11/27/19  9:32 PM  Result Value Ref Range   Glucose-Capillary 253 (H) 70 - 99 mg/dL  SARS CORONAVIRUS 2 (TAT 6-24 HRS) Nasopharyngeal Nasopharyngeal Swab     Status: None   Collection Time: 11/28/19  1:55 AM   Specimen: Nasopharyngeal Swab  Result Value Ref Range   SARS Coronavirus 2 NEGATIVE NEGATIVE    Comment: (NOTE) SARS-CoV-2 target nucleic acids are NOT DETECTED. The  SARS-CoV-2 RNA is generally detectable in upper and lower respiratory specimens during the acute phase of infection. Negative results do not preclude SARS-CoV-2 infection, do not rule out co-infections with other pathogens, and should not be used as the sole basis for treatment or other patient management decisions. Negative results must be combined with clinical observations, patient history, and epidemiological information. The expected result is Negative. Fact Sheet for Patients: HairSlick.no Fact Sheet for Healthcare Providers: quierodirigir.com This test is not yet approved or cleared by the [place] FDA and  has been authorized for detection and/or diagnosis of SARS-CoV-2 by FDA under an Emergency Use Authorization (EUA). This EUA will remain  in effect (meaning this test can be used) for the duration of the COVID-19 declaration under Section 56 4(b)(1) of the Act, 21 U.S.C. section 360bbb-3(b)(1), unless the authorization is terminated or revoked sooner. Performed at [facility], [address], [place], [place] [address]   Basic metabolic panel     Status: Abnormal   Collection Time: 11/28/19  4:36 AM  Result Value Ref Range   Sodium 136 135 - 145 mmol/L   Potassium 4.9 3.5 - 5.1 mmol/L   Chloride 98 98 - 111 mmol/L   CO2 29 22 - 32 mmol/L   Glucose, Bld 258 (H) 70 - 99 mg/dL   BUN 17 6 - 20 mg/dL   Creatinine, Ser 4.28 0.61 - 1.24 mg/dL   Calcium 8.8 (L) 8.9 - 10.3 mg/dL   GFR calc non Af Amer >60 >60 mL/min   GFR calc Af Amer >60 >60  mL/min   Anion gap 9 5 - 15    Comment: Performed at [facility], [address] [place], [place] [address]  CBC     Status: Abnormal   Collection Time: 11/28/19  4:36 AM  Result Value Ref Range   WBC 10.3 4.0 - 10.5 K/uL   RBC 6.32 (H) 4.22 - 5.81 MIL/uL   Hemoglobin 18.9 (H) 13.0 - 17.0 g/dL   HCT 65.7 (H) 84.6 - 96.2 %   MCV 89.9 80.0 - 100.0 fL   MCH  29.9 26.0 - 34.0 pg   MCHC 33.3 30.0 - 36.0 g/dL   RDW 95.2 84.1 - 32.4 %   Platelets 112 (L) 150 - 400 K/uL    Comment: Immature Platelet Fraction may be clinically indicated, consider ordering this additional test [id number]    nRBC 0.0 0.0 - 0.2 %    Comment: Performed at [facility], [address] [place], [place] [address]  C-reactive protein     Status: Abnormal   Collection Time: 11/28/19  4:36 AM  Result Value Ref Range   CRP 2.3 (H) <1.0 mg/dL    Comment: Performed at [facility], [address], [place], [place] [address]  Procalcitonin - Baseline     Status: None   Collection Time: 11/28/19  4:36 AM  Result Value Ref Range   Procalcitonin 0.16 ng/mL    Comment:        Interpretation: PCT (Procalcitonin) <= 0.5 ng/mL: Systemic infection (sepsis) is not likely. Local bacterial infection is possible. (NOTE)       Sepsis PCT Algorithm           Lower Respiratory Tract                                      Infection PCT Algorithm    ----------------------------     ----------------------------         PCT < 0.25 ng/mL                PCT < 0.10 ng/mL         Strongly encourage             Strongly discourage   discontinuation of antibiotics    initiation of antibiotics    ----------------------------     -----------------------------       PCT 0.25 - 0.50 ng/mL            PCT 0.10 - 0.25 ng/mL               OR       >80% decrease in PCT            Discourage initiation of                                            antibiotics      Encourage discontinuation           of antibiotics    ----------------------------     -----------------------------         PCT >= 0.50 ng/mL              PCT 0.26 - 0.50 ng/mL               AND        <  80% decrease in PCT             Encourage initiation of                                             antibiotics       Encourage continuation           of antibiotics    ----------------------------      -----------------------------        PCT >= 0.50 ng/mL                  PCT > 0.50 ng/mL               AND         increase in PCT                  Strongly encourage                                      initiation of antibiotics    Strongly encourage escalation           of antibiotics                                     -----------------------------                                           PCT <= 0.25 ng/mL                                                 OR                                        > 80% decrease in PCT                                     Discontinue / Do not initiate                                             antibiotics Performed at [facility], [address] [place], [place] [address]   Glucose, capillary     Status: Abnormal   Collection Time: 11/28/19  8:14 AM  Result Value Ref Range   Glucose-Capillary 220 (H) 70 - 99 mg/dL   Comment 1 Notify RN   Glucose, capillary     Status: Abnormal   Collection Time: 11/28/19 10:00 AM  Result Value Ref Range   Glucose-Capillary 256 (H) 70 - 99 mg/dL  Glucose, capillary     Status: Abnormal   Collection Time: 11/28/19 12:41 PM  Result Value Ref Range   Glucose-Capillary 229 (H) 70 -  99 mg/dL   Comment 1 Notify RN     CT ANGIO CHEST PE W OR WO CONTRAST  Result Date: 11/28/2019 CLINICAL DATA:  Shortness of breath and respiratory failure EXAM: CT ANGIOGRAPHY CHEST WITH CONTRAST TECHNIQUE: Multidetector CT imaging of the chest was performed using the standard protocol during bolus administration of intravenous contrast. Multiplanar CT image reconstructions and MIPs were obtained to evaluate the vascular anatomy. CONTRAST:  50mL OMNIPAQUE IOHEXOL 350 MG/ML SOLN COMPARISON:  CT chest 11/27/2019 FINDINGS: Cardiovascular: Satisfactory opacification of the pulmonary arteries to the segmental level. No evidence of pulmonary embolism. Nonaneurysmal aorta. No dissection is seen. Heart size within normal limits. No  pericardial effusion Mediastinum/Nodes: No enlarged mediastinal, hilar, or axillary lymph nodes. Thyroid gland, trachea, and esophagus demonstrate no significant findings. Lungs/Pleura: No pleural effusion or pneumothorax. Partial consolidations in the bilateral lower lobes and right middle lobe favored to represent multifocal atelectasis over pneumonia. Upper Abdomen: No acute abnormality. Musculoskeletal: No chest wall abnormality. No acute or significant osseous findings. Review of the MIP images confirms the above findings. IMPRESSION: 1. Negative for acute pulmonary embolus or aortic dissection 2. Partial consolidations within the bilateral lower lobes and right middle lobe favored to represent multifocal atelectasis over pneumonia. Electronically Signed   By: Jasmine Pang M.D.   On: 11/28/2019 03:51   CT Angio Chest PE W and/or Wo Contrast  Result Date: 11/27/2019 CLINICAL DATA:  Shortness of breath, history of small cell lung carcinoma with hypoxia EXAM: CT ANGIOGRAPHY CHEST WITH CONTRAST TECHNIQUE: Multidetector CT imaging of the chest was performed using the standard protocol during bolus administration of intravenous contrast. Multiplanar CT image reconstructions and MIPs were obtained to evaluate the vascular anatomy. CONTRAST:  OMNIPAQUE IOHEXOL 350 MG/ML SOLN COMPARISON:  08/30/2018 FINDINGS: Cardiovascular: Thoracic aorta demonstrates a normal branching pattern without aneurysmal dilatation or dissection. No cardiac enlargement is seen. No pericardial effusion is noted. No significant coronary calcifications are seen. The pulmonary artery shows a normal branching pattern. No definitive filling defects are identified to suggest pulmonary emboli. Mediastinum/Nodes: Thoracic inlet is within normal limits. No hilar or mediastinal adenopathy is noted. The esophagus as visualized is within normal limits. Lungs/Pleura: The lungs are well aerated bilaterally. Minimal bibasilar atelectatic changes are  noted left slightly greater than right. No focal confluent infiltrate or sizable effusion is seen. No parenchymal nodules or recurrent mass is noted. Upper Abdomen: Visualized upper abdomen demonstrates mild fatty infiltration of the liver. The gallbladder has been surgically removed. Musculoskeletal: Degenerative changes of the thoracic spine are seen. No acute bony abnormality is noted. Review of the MIP images confirms the above findings. IMPRESSION: No evidence of pulmonary emboli. Mild bibasilar atelectatic changes. Fatty liver. Electronically Signed   By: Alcide Clever M.D.   On: 11/27/2019 13:15   US Venous Img Lower Unilateral Right  Result Date: 11/27/2019 CLINICAL DATA:  Right lower extremity swelling and edema for 1 month EXAM: RIGHT LOWER EXTREMITY VENOUS DOPPLER ULTRASOUND TECHNIQUE: Gray-scale sonography with graded compression, as well as color Doppler and duplex ultrasound were performed to evaluate the lower extremity deep venous systems from the level of the common femoral vein and including the common femoral, femoral, profunda femoral, popliteal and calf veins including the posterior tibial, peroneal and gastrocnemius veins when visible. The superficial great saphenous vein was also interrogated. Spectral Doppler was utilized to evaluate flow at rest and with distal augmentation maneuvers in the common femoral, femoral and popliteal veins. COMPARISON:  None. FINDINGS: Contralateral Common Femoral Vein:  Respiratory phasicity is normal and symmetric with the symptomatic side. No evidence of thrombus. Normal compressibility. Common Femoral Vein: No evidence of thrombus. Normal compressibility, respiratory phasicity and response to augmentation. Saphenofemoral Junction: No evidence of thrombus. Normal compressibility and flow on color Doppler imaging. Profunda Femoral Vein: No evidence of thrombus. Normal compressibility and flow on color Doppler imaging. Femoral Vein: No evidence of thrombus.  Normal compressibility, respiratory phasicity and response to augmentation. Popliteal Vein: No evidence of thrombus. Normal compressibility, respiratory phasicity and response to augmentation. Calf Veins: No evidence of thrombus. Normal compressibility and flow on color Doppler imaging. IMPRESSION: No evidence of deep venous thrombosis. Electronically Signed   By: Judie Petit.  Shick M.D.   On: 11/27/2019 14:09   DG Chest Portable 1 View  Result Date: 11/27/2019 CLINICAL DATA:  Short of breath and cough. EXAM: PORTABLE CHEST 1 VIEW COMPARISON:  08/30/2018 FINDINGS: Normal heart size. Short decreased lung volumes. No pleural effusion or edema. No airspace opacities identified. IMPRESSION: No active disease. Electronically Signed   By: Signa Kell M.D.   On: 11/27/2019 12:15   DG Foot Complete Right  Result Date: 11/27/2019 CLINICAL DATA:  No new injury, pain, swelling and redness top of right foot. Previous fx right mid foot about 5 yrs ago. EXAM: RIGHT FOOT COMPLETE - 3+ VIEW COMPARISON:  None. FINDINGS: There is a mildly comminuted fracture in the mid shaft of the second metatarsal. There is some callus formation suggesting subacute injury. No other acute bony abnormality in the right foot. There is dorsal soft tissue swelling. IMPRESSION: Mildly comminuted fracture of the second metatarsal shaft with some callus formation suggesting subacute injury. Electronically Signed   By: Emmaline Kluver M.D.   On: 11/27/2019 12:21    Review of Systems  Constitutional: Negative for chills and fever.  HENT: Negative for congestion and sore throat.   Respiratory: Negative for cough and shortness of breath.   Cardiovascular: Negative for chest pain and palpitations.  Gastrointestinal: Negative for nausea and vomiting.  Musculoskeletal:       Patient does relate some swelling in his right foot with no history of injury  Neurological: Negative for numbness.   Blood pressure 135/90, pulse 97, temperature 98.4 F (36.9  C), temperature source Oral, resp. rate 15, height 6' (1.829 m), weight 127 kg, SpO2 91 %. Physical Exam  Cardiovascular:  DP and PT pulses palpable on the left.  Not palpable on the right due to intact splinting  Musculoskeletal:     Comments: Adequate range of motion in the left foot.  Pain on palpation on the dorsum of the right forefoot over the second metatarsal area.  Neurological:  Protective threshold with a monofilament wire appears to be grossly intact and symmetric.  Skin:  Some edema was present in the right foot.  No open lesions.    Assessment/Plan: Assessment: 1.  Fracture right second metatarsal with some comminution but good alignment. 2.  Diabetes  Plan: Patient was instructed to continue in the splint on the right foot with nonweightbearing using crutches.  Discussed consequences of weightbearing including displacement of the fracture which at this point is in good alignment.  Discussed that on his discharge we can see him in the office and switch him over to a fracture boot that he can take on and off and at that point may be place a little bit of weight on the heel only.  Recommend follow-up outpatient upon discharge.  Ricci Barker 11/28/2019, 1:08 PM

## 2019-11-28 NOTE — Progress Notes (Signed)
*  PRELIMINARY RESULTS* Echocardiogram 2D Echocardiogram has been performed.  [name] 11/28/2019, 9:02 AM

## 2019-11-28 NOTE — Progress Notes (Signed)
HPI: Patient states that he has been having right foot pain right foot swelling for more than 1 month, denies any injury. His pain is constant, sharp, 10 out of 10 severity, nonradiating.  Patient denies any shortness of breath, cough or chest pain, but he was found to have oxygen desaturation to 86% on room air in ED. No fever or chills.  No nausea, vomiting, diarrhea, abdominal pain, symptoms of UTI or unilateral weakness.  Patient states that he used to use CPAP, but stopped using CPAP approximately 2 years ago. ED Course: pt was found to have WBC 8.3, troponin V, BNP 18, negative Covid Ag test, electrolytes renal function okay, abnormal liver function (ALP 76, AST 125, ALT 91, total bilirubin 0.8), temperature normal, blood pressure 125/99, heart rate in 90s, RR 20, chest x-ray negative.  CT angiogram is negative for PE.  Right lower extremity Doppler is negative for DVT.   X-ray of right foot showed mildly comminuted fracture of the second metatarsal shaft with some callus formation suggesting subacute injury.  Dr.Cline of podiatry was consulted. Pt is placed on med-surg bed for obs.  Subjective: Patient is still having shortness of breath and requiring supplemental oxygen of 5 to 6 L via nasal cannula.  No new fever.  Objective: Vital signs in last 24 hours: Temp:  [98.4 F (36.9 C)-98.9 F (37.2 C)] 98.4 F (36.9 C) (01/04 1244) Pulse Rate:  [88-101] 97 (01/04 1244) Resp:  [14-20] 15 (01/04 1244) BP: (120-146)/(85-94) 135/90 (01/04 1244) SpO2:  [90 %-93 %] 91 % (01/04 1244)  Intake/Output from previous day: No intake/output data recorded. Intake/Output this shift: No intake/output data recorded.  General: Not in acute distress HEENT:       Eyes: PERRL, EOMI, no scleral icterus.       ENT: No discharge from the ears and nose, no pharynx injection, no tonsillar enlargement.        Neck: No JVD, no bruit, no mass felt. Heme: No neck lymph node enlargement. Cardiac: S1/S2, RRR, No  murmurs, No gallops or rubs. Respiratory:  Coarse breathing and mild crackles GI: Soft, nondistended, nontender, no rebound pain, no organomegaly, BS present. GU: No hematuria Ext: 1+DP/PT pulse bilaterally. Has swelling and tenderness in the right foot dorsal area. No ovelying warmth. Musculoskeletal: No joint deformities, No joint redness or warmth, no limitation of ROM in spin. Skin: No rashes.  Neuro: Alert, oriented X3, cranial nerves II-XII grossly intact, moves all extremities normally. Psych: Patient is not psychotic, no suicidal or hemocidal ideation.  Results for orders placed or performed during the hospital encounter of 11/27/19 (from the past 24 hour(s))  Glucose, capillary     Status: Abnormal   Collection Time: 11/27/19  5:30 PM  Result Value Ref Range   Glucose-Capillary 153 (H) 70 - 99 mg/dL  Glucose, capillary     Status: Abnormal   Collection Time: 11/27/19  9:32 PM  Result Value Ref Range   Glucose-Capillary 253 (H) 70 - 99 mg/dL  SARS CORONAVIRUS 2 (TAT 6-24 HRS) Nasopharyngeal Nasopharyngeal Swab     Status: None   Collection Time: 11/28/19  1:55 AM   Specimen: Nasopharyngeal Swab  Result Value Ref Range   SARS Coronavirus 2 NEGATIVE NEGATIVE  Basic metabolic panel     Status: Abnormal   Collection Time: 11/28/19  4:36 AM  Result Value Ref Range   Sodium 136 135 - 145 mmol/L   Potassium 4.9 3.5 - 5.1 mmol/L   Chloride 98 98 - 111  mmol/L   CO2 29 22 - 32 mmol/L   Glucose, Bld 258 (H) 70 - 99 mg/dL   BUN 17 6 - 20 mg/dL   Creatinine, Ser 0.76 0.61 - 1.24 mg/dL   Calcium 8.8 (L) 8.9 - 10.3 mg/dL   GFR calc non Af Amer >60 >60 mL/min   GFR calc Af Amer >60 >60 mL/min   Anion gap 9 5 - 15  CBC     Status: Abnormal   Collection Time: 11/28/19  4:36 AM  Result Value Ref Range   WBC 10.3 4.0 - 10.5 K/uL   RBC 6.32 (H) 4.22 - 5.81 MIL/uL   Hemoglobin 18.9 (H) 13.0 - 17.0 g/dL   HCT 56.8 (H) 39.0 - 52.0 %   MCV 89.9 80.0 - 100.0 fL   MCH 29.9 26.0 - 34.0 pg    MCHC 33.3 30.0 - 36.0 g/dL   RDW 14.6 11.5 - 15.5 %   Platelets 112 (L) 150 - 400 K/uL   nRBC 0.0 0.0 - 0.2 %  C-reactive protein     Status: Abnormal   Collection Time: 11/28/19  4:36 AM  Result Value Ref Range   CRP 2.3 (H) <1.0 mg/dL  Procalcitonin - Baseline     Status: None   Collection Time: 11/28/19  4:36 AM  Result Value Ref Range   Procalcitonin 0.16 ng/mL  Glucose, capillary     Status: Abnormal   Collection Time: 11/28/19  8:14 AM  Result Value Ref Range   Glucose-Capillary 220 (H) 70 - 99 mg/dL   Comment 1 Notify RN   Glucose, capillary     Status: Abnormal   Collection Time: 11/28/19 10:00 AM  Result Value Ref Range   Glucose-Capillary 256 (H) 70 - 99 mg/dL  Glucose, capillary     Status: Abnormal   Collection Time: 11/28/19 12:41 PM  Result Value Ref Range   Glucose-Capillary 229 (H) 70 - 99 mg/dL   Comment 1 Notify RN     Studies/Results: CT ANGIO CHEST PE W OR WO CONTRAST  Result Date: 11/28/2019 CLINICAL DATA:  Shortness of breath and respiratory failure EXAM: CT ANGIOGRAPHY CHEST WITH CONTRAST TECHNIQUE: Multidetector CT imaging of the chest was performed using the standard protocol during bolus administration of intravenous contrast. Multiplanar CT image reconstructions and MIPs were obtained to evaluate the vascular anatomy. CONTRAST:  68mL OMNIPAQUE IOHEXOL 350 MG/ML SOLN COMPARISON:  CT chest 11/27/2019 FINDINGS: Cardiovascular: Satisfactory opacification of the pulmonary arteries to the segmental level. No evidence of pulmonary embolism. Nonaneurysmal aorta. No dissection is seen. Heart size within normal limits. No pericardial effusion Mediastinum/Nodes: No enlarged mediastinal, hilar, or axillary lymph nodes. Thyroid gland, trachea, and esophagus demonstrate no significant findings. Lungs/Pleura: No pleural effusion or pneumothorax. Partial consolidations in the bilateral lower lobes and right middle lobe favored to represent multifocal atelectasis over  pneumonia. Upper Abdomen: No acute abnormality. Musculoskeletal: No chest wall abnormality. No acute or significant osseous findings. Review of the MIP images confirms the above findings. IMPRESSION: 1. Negative for acute pulmonary embolus or aortic dissection 2. Partial consolidations within the bilateral lower lobes and right middle lobe favored to represent multifocal atelectasis over pneumonia. Electronically Signed   By: Donavan Foil M.D.   On: 11/28/2019 03:51   CT Angio Chest PE W and/or Wo Contrast  Result Date: 11/27/2019 CLINICAL DATA:  Shortness of breath, history of small cell lung carcinoma with hypoxia EXAM: CT ANGIOGRAPHY CHEST WITH CONTRAST TECHNIQUE: Multidetector CT imaging of the chest was  performed using the standard protocol during bolus administration of intravenous contrast. Multiplanar CT image reconstructions and MIPs were obtained to evaluate the vascular anatomy. CONTRAST:  100mL OMNIPAQUE IOHEXOL 350 MG/ML SOLN COMPARISON:  08/30/2018 FINDINGS: Cardiovascular: Thoracic aorta demonstrates a normal branching pattern without aneurysmal dilatation or dissection. No cardiac enlargement is seen. No pericardial effusion is noted. No significant coronary calcifications are seen. The pulmonary artery shows a normal branching pattern. No definitive filling defects are identified to suggest pulmonary emboli. Mediastinum/Nodes: Thoracic inlet is within normal limits. No hilar or mediastinal adenopathy is noted. The esophagus as visualized is within normal limits. Lungs/Pleura: The lungs are well aerated bilaterally. Minimal bibasilar atelectatic changes are noted left slightly greater than right. No focal confluent infiltrate or sizable effusion is seen. No parenchymal nodules or recurrent mass is noted. Upper Abdomen: Visualized upper abdomen demonstrates mild fatty infiltration of the liver. The gallbladder has been surgically removed. Musculoskeletal: Degenerative changes of the thoracic spine  are seen. No acute bony abnormality is noted. Review of the MIP images confirms the above findings. IMPRESSION: No evidence of pulmonary emboli. Mild bibasilar atelectatic changes. Fatty liver. Electronically Signed   By: Alcide CleverMark  Lukens M.D.   On: 11/27/2019 13:15   US Venous Img Lower Unilateral Right  Result Date: 11/27/2019 CLINICAL DATA:  Right lower extremity swelling and edema for 1 month EXAM: RIGHT LOWER EXTREMITY VENOUS DOPPLER ULTRASOUND TECHNIQUE: Gray-scale sonography with graded compression, as well as color Doppler and duplex ultrasound were performed to evaluate the lower extremity deep venous systems from the level of the common femoral vein and including the common femoral, femoral, profunda femoral, popliteal and calf veins including the posterior tibial, peroneal and gastrocnemius veins when visible. The superficial great saphenous vein was also interrogated. Spectral Doppler was utilized to evaluate flow at rest and with distal augmentation maneuvers in the common femoral, femoral and popliteal veins. COMPARISON:  None. FINDINGS: Contralateral Common Femoral Vein: Respiratory phasicity is normal and symmetric with the symptomatic side. No evidence of thrombus. Normal compressibility. Common Femoral Vein: No evidence of thrombus. Normal compressibility, respiratory phasicity and response to augmentation. Saphenofemoral Junction: No evidence of thrombus. Normal compressibility and flow on color Doppler imaging. Profunda Femoral Vein: No evidence of thrombus. Normal compressibility and flow on color Doppler imaging. Femoral Vein: No evidence of thrombus. Normal compressibility, respiratory phasicity and response to augmentation. Popliteal Vein: No evidence of thrombus. Normal compressibility, respiratory phasicity and response to augmentation. Calf Veins: No evidence of thrombus. Normal compressibility and flow on color Doppler imaging. IMPRESSION: No evidence of deep venous thrombosis.  Electronically Signed   By: Judie PetitM.  Shick M.D.   On: 11/27/2019 14:09   DG Chest Portable 1 View  Result Date: 11/27/2019 CLINICAL DATA:  Short of breath and cough. EXAM: PORTABLE CHEST 1 VIEW COMPARISON:  08/30/2018 FINDINGS: Normal heart size. Short decreased lung volumes. No pleural effusion or edema. No airspace opacities identified. IMPRESSION: No active disease. Electronically Signed   By: Signa Kellaylor  Stroud M.D.   On: 11/27/2019 12:15   DG Foot Complete Right  Result Date: 11/27/2019 CLINICAL DATA:  No new injury, pain, swelling and redness top of right foot. Previous fx right mid foot about 5 yrs ago. EXAM: RIGHT FOOT COMPLETE - 3+ VIEW COMPARISON:  None. FINDINGS: There is a mildly comminuted fracture in the mid shaft of the second metatarsal. There is some callus formation suggesting subacute injury. No other acute bony abnormality in the right foot. There is dorsal soft tissue swelling. IMPRESSION:  Mildly comminuted fracture of the second metatarsal shaft with some callus formation suggesting subacute injury. Electronically Signed   By: Emmaline Kluver M.D.   On: 11/27/2019 12:21   ECHOCARDIOGRAM COMPLETE  Result Date: 11/28/2019   ECHOCARDIOGRAM REPORT   Patient Name:   LABRADFORD SCHNITKER Date of Exam: 11/28/2019 Medical Rec #:  [id number]       Height:       72.0 in Accession #:    [id number]      Weight:       280.0 lb Date of Birth:  10-28-1966       BSA:          2.46 m Patient Age:    53 years        BP:           137/89 mmHg Patient Gender: M               HR:           99 bpm. Exam Location:  [facility] Procedure: 2D Echo, Cardiac Doppler and Color Doppler Indications:     CHF- acute diastolic 428.31  History:         Patient has prior history of Echocardiogram examinations, most                  recent 09/01/2018. Risk Factors:Sleep Apnea. Bipolar disorder.  Sonographer:     [name] RDCS (AE) Referring Phys:  [id number] Brien Few NIU Diagnosing Phys: Lorine Bears MD IMPRESSIONS  1. Left ventricular ejection  fraction, by visual estimation, is 60 to 65%. The left ventricle has normal function. There is moderately increased left ventricular septal hypertrophy.  2. Left ventricular diastolic function could not be evaluated.  3. The left ventricle has no regional wall motion abnormalities.  4. Global right ventricle has normal systolic function.The right ventricular size is normal. No increase in right ventricular wall thickness.  5. The pericardial effusion is posterior to the left ventricle.  6. Trivial pericardial effusion is present.  7. The mitral valve is normal in structure. No evidence of mitral valve regurgitation. No evidence of mitral stenosis.  8. The tricuspid valve is normal in structure.  9. The aortic valve is normal in structure. Aortic valve regurgitation is not visualized. No evidence of aortic valve sclerosis or stenosis. 10. The pulmonic valve was normal in structure. Pulmonic valve regurgitation is not visualized. 11. TR signal is inadequate for assessing pulmonary artery systolic pressure. FINDINGS  Left Ventricle: Left ventricular ejection fraction, by visual estimation, is 60 to 65%. The left ventricle has normal function. The left ventricle has no regional wall motion abnormalities. There is moderately increased left ventricular hypertrophy. Septal left ventricular hypertrophy. Left ventricular diastolic function could not be evaluated. Normal left atrial pressure. Right Ventricle: The right ventricular size is normal. No increase in right ventricular wall thickness. Global RV systolic function is has normal systolic function. Left Atrium: Left atrial size was normal in size. Right Atrium: Right atrial size was normal in size Pericardium: Trivial pericardial effusion is present. The pericardial effusion is posterior to the left ventricle. Mitral Valve: The mitral valve is normal in structure. No evidence of mitral valve regurgitation. No evidence of mitral valve stenosis by observation. Tricuspid  Valve: The tricuspid valve is normal in structure. Tricuspid valve regurgitation is not demonstrated. Aortic Valve: The aortic valve is normal in structure. Aortic valve regurgitation is not visualized. The aortic valve is structurally normal, with no evidence of sclerosis or  stenosis. Pulmonic Valve: The pulmonic valve was normal in structure. Pulmonic valve regurgitation is not visualized. Pulmonic regurgitation is not visualized. Aorta: The aortic root, ascending aorta and aortic arch are all structurally normal, with no evidence of dilitation or obstruction. Venous: The inferior vena cava was not well visualized. IAS/Shunts: No atrial level shunt detected by color flow Doppler. There is no evidence of a patent foramen ovale. No ventricular septal defect is seen or detected. There is no evidence of an atrial septal defect.  LEFT VENTRICLE PLAX 2D LVIDd:         3.80 cm LVIDs:         2.63 cm LV PW:         0.98 cm LV IVS:        1.75 cm LVOT diam:     2.20 cm LV SV:         37 ml LV SV Index:   14.15 LVOT Area:     3.80 cm  LEFT ATRIUM         Index LA diam:    3.30 cm 1.34 cm/m                        PULMONIC VALVE AORTA                 PV Vmax:        0.91 m/s Ao Root diam: 3.80 cm PV Peak grad:   3.3 mmHg                       RVOT Peak grad: 2 mmHg   SHUNTS Systemic Diam: 2.20 cm  Lorine Bears MD Electronically signed by Lorine Bears MD Signature Date/Time: 11/28/2019/1:11:45 PM    Final     Scheduled Meds: . ARIPiprazole  20 mg Oral QHS  . dextromethorphan-guaiFENesin  1 tablet Oral BID  . doxepin  300 mg Oral QHS  . enoxaparin (LOVENOX) injection  40 mg Subcutaneous Q24H  . insulin aspart  0-5 Units Subcutaneous QHS  . insulin aspart  0-9 Units Subcutaneous TID WC  . QUEtiapine  800 mg Oral QHS   Continuous Infusions: PRN Meds:Ipratropium-Albuterol, ondansetron (ZOFRAN) IV, oxyCODONE  Assessment/Plan: Acute respiratory failure with hypoxia [facility]):  Still requiring 6 L supplemental oxygen   - etiology is not clear.  Chest x-ray negative.  COVID-19 antigen negative.   CT angiogram is negative for PE.  BNP 18,  does not seem to have acute CHF.  Patient has history of OSA, which may have contributed partially.  Will get 2D echo for further evaluation to rule out other possibilities, such as pulmonary hypertension. -CPAP -Bronchodilators -Nasal cannula oxygen to make the oxygen saturation above 93% - Echo shows  1. Left ventricular ejection fraction, by visual estimation, is 60 to 65%. The left ventricle has normal function. There is moderately increased left ventricular septal hypertrophy.  2. Left ventricular diastolic function could not be evaluated. -Wean down to room air as tolerated -Continue breathing treatment -May give a steroid trial  Closed fracture of second metatarsal bone of right foot: Dr. Alberteen Spindle of podiatry was consulted --> "Pt. will need a splint and crutches for non wt. Bearing".  -Dr. Roxan Hockey of ED will kindly help to put patient on splint and crutch - continue in the splint on the right foot with nonweightbearing using crutches.  - Podiatry recs, "Discussed consequences of weightbearing including displacement of the fracture which at this  point is in good alignment.  Discussed that on his discharge we can see him in the office and switch him over to a fracture boot that he can take on and off and at that point may be place a little bit of weight on the heel only"  - Still having pain  -prn oxycodone for pain  Bipolar disorder (HCC): Stable -continue home Abilify, doxepin, Seroquel  Diabetes mellitus without complication: Last A1c 7.3, poorly controled. Patient is taking Metformin at home; hold for now -SSI  Sleep apnea: -CPAP -However patient continues to refuse it and his saturation drops when he sleeps  Abnormal LFTs: -check hepatitis panel --negative nonreactive and HIV antibody also negative nonreactive -May repeat liver function test -Avoid  using Tylenol    DVT ppx: SQ Lovenox Code Status: Full code Family Communication: None at bed side.    Disposition Plan:  Anticipate discharge back to previous home environment Consults called:  Dr. Alberteen Spindle of podiatry Admission status: Inpatient   LOS: 0 days   Keric Zehren Harold Hedge

## 2019-11-28 NOTE — Progress Notes (Signed)
Spoke with Tammy from Infection Prevention about patient's 2 negative COVID test results. She stated that patient should stay on Isolation while he has COVID signs and symptoms unless another diagnosis can be made.

## 2019-11-29 ENCOUNTER — Inpatient Hospital Stay: Payer: Medicare Other

## 2019-11-29 DIAGNOSIS — J9601 Acute respiratory failure with hypoxia: Principal | ICD-10-CM

## 2019-11-29 LAB — BLOOD GAS, ARTERIAL
Acid-Base Excess: 6.5 mmol/L — ABNORMAL HIGH (ref 0.0–2.0)
Acid-Base Excess: 7.2 mmol/L — ABNORMAL HIGH (ref 0.0–2.0)
Acid-Base Excess: 9.6 mmol/L — ABNORMAL HIGH (ref 0.0–2.0)
Bicarbonate: 35 mmol/L — ABNORMAL HIGH (ref 20.0–28.0)
Bicarbonate: 36.4 mmol/L — ABNORMAL HIGH (ref 20.0–28.0)
Bicarbonate: 38.5 mmol/L — ABNORMAL HIGH (ref 20.0–28.0)
Delivery systems: POSITIVE
Delivery systems: POSITIVE
Expiratory PAP: 11
Expiratory PAP: 9
FIO2: 0.6
FIO2: 0.65
FIO2: 0.36
Inspiratory PAP: 18
O2 Saturation: 92.6 %
O2 Saturation: 96 %
O2 Saturation: 88.7 %
Patient temperature: 37
Patient temperature: 37
Patient temperature: 37
RATE: 14 resp/min
pCO2 arterial: 68 mmHg (ref 32.0–48.0)
pCO2 arterial: 74 mmHg (ref 32.0–48.0)
pCO2 arterial: 73 mmHg (ref 32.0–48.0)
pH, Arterial: 7.32 — ABNORMAL LOW (ref 7.350–7.450)
pH, Arterial: 7.3 — ABNORMAL LOW (ref 7.350–7.450)
pH, Arterial: 7.33 — ABNORMAL LOW (ref 7.350–7.450)
pO2, Arterial: 71 mmHg — ABNORMAL LOW (ref 83.0–108.0)
pO2, Arterial: 90 mmHg (ref 83.0–108.0)
pO2, Arterial: 60 mmHg — ABNORMAL LOW (ref 83.0–108.0)

## 2019-11-29 LAB — CBC WITH DIFFERENTIAL/PLATELET
Abs Immature Granulocytes: 0.03 10*3/uL (ref 0.00–0.07)
Basophils Absolute: 0 10*3/uL (ref 0.0–0.1)
Basophils Relative: 0 %
Eosinophils Absolute: 0.1 10*3/uL (ref 0.0–0.5)
Eosinophils Relative: 1 %
HCT: 56.5 % — ABNORMAL HIGH (ref 39.0–52.0)
Hemoglobin: 18.1 g/dL — ABNORMAL HIGH (ref 13.0–17.0)
Immature Granulocytes: 0 %
Lymphocytes Relative: 14 %
Lymphs Abs: 1.2 10*3/uL (ref 0.7–4.0)
MCH: 30.1 pg (ref 26.0–34.0)
MCHC: 32 g/dL (ref 30.0–36.0)
MCV: 94 fL (ref 80.0–100.0)
Monocytes Absolute: 0.6 10*3/uL (ref 0.1–1.0)
Monocytes Relative: 6 %
Neutro Abs: 7 10*3/uL (ref 1.7–7.7)
Neutrophils Relative %: 79 %
Platelets: 119 10*3/uL — ABNORMAL LOW (ref 150–400)
RBC: 6.01 MIL/uL — ABNORMAL HIGH (ref 4.22–5.81)
RDW: 14 % (ref 11.5–15.5)
WBC: 8.9 10*3/uL (ref 4.0–10.5)
nRBC: 0 % (ref 0.0–0.2)

## 2019-11-29 LAB — HEPATIC FUNCTION PANEL
ALT: 76 U/L — ABNORMAL HIGH (ref 0–44)
AST: 66 U/L — ABNORMAL HIGH (ref 15–41)
Albumin: 3.5 g/dL (ref 3.5–5.0)
Alkaline Phosphatase: 65 U/L (ref 38–126)
Bilirubin, Direct: 0.3 mg/dL — ABNORMAL HIGH (ref 0.0–0.2)
Indirect Bilirubin: 0.9 mg/dL (ref 0.3–0.9)
Total Bilirubin: 1.2 mg/dL (ref 0.3–1.2)
Total Protein: 7.1 g/dL (ref 6.5–8.1)

## 2019-11-29 LAB — URINALYSIS, COMPLETE (UACMP) WITH MICROSCOPIC
Bacteria, UA: NONE SEEN
Bilirubin Urine: NEGATIVE
Glucose, UA: 150 mg/dL — AB
Hgb urine dipstick: NEGATIVE
Ketones, ur: NEGATIVE mg/dL
Nitrite: NEGATIVE
Protein, ur: NEGATIVE mg/dL
Specific Gravity, Urine: 1.027 (ref 1.005–1.030)
Squamous Epithelial / HPF: NONE SEEN (ref 0–5)
pH: 5 (ref 5.0–8.0)

## 2019-11-29 LAB — GLUCOSE, CAPILLARY
Glucose-Capillary: 209 mg/dL — ABNORMAL HIGH (ref 70–99)
Glucose-Capillary: 272 mg/dL — ABNORMAL HIGH (ref 70–99)
Glucose-Capillary: 217 mg/dL — ABNORMAL HIGH (ref 70–99)
Glucose-Capillary: 198 mg/dL — ABNORMAL HIGH (ref 70–99)
Glucose-Capillary: 236 mg/dL — ABNORMAL HIGH (ref 70–99)
Glucose-Capillary: 266 mg/dL — ABNORMAL HIGH (ref 70–99)

## 2019-11-29 LAB — MRSA PCR SCREENING: MRSA by PCR: NEGATIVE

## 2019-11-29 MED ORDER — NALOXONE HCL 0.4 MG/ML IJ SOLN
INTRAMUSCULAR | Status: AC
  Administered 2019-11-29: 01:00:00 0.4 mg
  Filled 2019-11-29: qty 1

## 2019-11-29 MED ORDER — CHLORHEXIDINE GLUCONATE CLOTH 2 % EX PADS: 6.0000 | MEDICATED_PAD | Freq: Every day | CUTANEOUS | Status: DC

## 2019-11-29 MED ORDER — INSULIN GLARGINE 100 UNIT/ML ~~LOC~~ SOLN
20.0000 [IU] | Freq: Every day | SUBCUTANEOUS | Status: DC
  Administered 2019-11-29: 20 [IU] via SUBCUTANEOUS
  Filled 2019-11-29 (×2): qty 0.2

## 2019-11-29 MED ORDER — CHLORHEXIDINE GLUCONATE CLOTH 2 % EX PADS
6.0000 | MEDICATED_PAD | Freq: Every day | CUTANEOUS | Status: DC
  Administered 2019-11-30 – 2019-12-01 (×2): 6 via TOPICAL

## 2019-11-29 MED ORDER — NALOXONE HCL 0.4 MG/ML IJ SOLN: 0.4000 mg | INTRAMUSCULAR | Status: DC | PRN

## 2019-11-29 NOTE — Consult Note (Addendum)
Randy Velez      Name: Randy Velez MRN: [id number] DOB: 06-20-1966    ADMISSION DATE:  11/27/2019 Velez DATE:  11/29/2019  REFERRING MD :  Dr. Izetta Dakin   CHIEF COMPLAINT:   Acute respiratory distress   HISTORY OF PRESENT ILLNESS    Randy Velez is a 54 yo male with a history of DMT2, bipolar disorder, OSA and obesity hypoventilation syndrome who presented to the ED on 11/26/2018 for right foot pain and edema x 1 month.  He described the pain as 10/10 in severity on the right dorsal foot.  He denied chest pain or SOB, but was hypoxic and satting 86% on room air in the ED.    Patient reports that he has had issues with shortness of breath previously.  He was prescribed a CPAP for OSA several years ago, but lost the machine.  He also describes an episode in October 2019 when he developed shortness of breath after a fall.  He was hospitalized at [facility] for acute respiratory failure with hypoxia secondary to pneumonia and obesity hypoinflation syndrome.  He is a former smoker, and reports a 2 pack year history from 1989-1991.  He does not drink alcohol.     ED workup demonstrated normal chest x-rays and no acute abnormality on x-ray of right foot.  CT angiogram showed no evidence of pulmonary embolism, and right lower extremity venous ultrasound showed no evidence of DVT.   C-reactive protein elevated at 2.3, BNP normal at 18.  Elevated LFTs at ALP 76, AST 125, ALT 91 and total bilirubin 0.8.    He was admitted to med-surg by Dr. Blaine Hamper for acute respiratory failure with hypoxia of unknown etiology.  HE was placed on 6 L nasal canula.  He continued home Abilify, doxepin, and Seroquel and was started on prn oxycodone for pain.  At 01:30 AM, rapid response was called when the patient became unresponsive and began having an SpO2 88-90%.  0.4 mg of narcan was given.  He was started on BiPAP and was transferred to ICU for monitoring.    SIGNIFICANT EVENTS   1/3:  Patient presented to ED for right foot pain and edema, O2 sat 86% on room air.   CT angiogram negative for PE, venous ultrasound negative for DVT.  Elevated LFTs, CRP, and HgbA1c.   1/4: Patient admitted to med/surg unit for further workup of acute respiratory failure, and oxygen.  Placed on oxycodone for foot pain in addition to home seroquel, doxepin and abilify.   1/5: Patient became non-responsive.  Narcan administered.  Started on BiPAP, transferred to ICU.    PAST MEDICAL HISTORY    :  Past Medical History:  Diagnosis Date  . Bipolar disorder ([place])   . Insomnia   . Sleep apnea    Past Surgical History:  Procedure Laterality Date  . CHOLECYSTECTOMY     Prior to Admission medications   Medication Sig Start Date End Date Taking? Authorizing Provider  ARIPiprazole (ABILIFY) 20 MG tablet Take 20 mg by mouth at bedtime.    Yes [provider]  doxepin (SINEQUAN) 100 MG capsule Take 300 mg by mouth at bedtime.    Yes [provider]  QUEtiapine (SEROQUEL) 400 MG tablet Take 800 mg by mouth at bedtime.    Yes [provider]  blood glucose meter kit and supplies KIT Dispense based on patient and insurance preference. Use up to four times daily as directed. (FOR ICD-9 250.00, 250.01). 09/01/18  Henreitta Leber, MD   Allergies  Allergen Reactions  . Lithium   . Morphine Other (See Comments)    Short of breath  . Risperidone   . Ziprasidone Hcl Anxiety     FAMILY HISTORY   Family History  Problem Relation Age of Onset  . Breast cancer Mother       SOCIAL HISTORY    reports that he has quit smoking. His smoking use included cigarettes. He has a 2.00 pack-year smoking history. He has quit using smokeless tobacco. He reports previous alcohol use. He reports previous drug use.  Review of Systems  Constitutional: Negative for chills, diaphoresis and fever.  Eyes: Negative for blurred vision.  Respiratory: Positive for shortness of breath. Negative  for cough.   Cardiovascular: Negative for chest pain and palpitations.  Gastrointestinal: Negative for abdominal pain, constipation and diarrhea.  Musculoskeletal:       Pain in dorsum of right foot       VITAL SIGNS    Temp:  [97.6 F (36.4 C)-98.5 F (36.9 C)] 97.7 F (36.5 C) (01/05 0756) Pulse Rate:  [84-94] 86 (01/05 0900) Resp:  [10-20] 10 (01/05 0900) BP: (124-151)/(82-116) 132/116 (01/05 0900) SpO2:  [88 %-94 %] 88 % (01/05 0900) Weight:  [130.1 kg] 130.1 kg (01/05 0652) HEMODYNAMICS:   VENTILATOR SETTINGS:   INTAKE / OUTPUT:  Intake/Output Summary (Last 24 hours) at 11/29/2019 1310 Last data filed at 11/29/2019 0322 Gross per 24 hour  Intake 0 ml  Output 1235 ml  Net -1235 ml       PHYSICAL EXAM   Physical Exam Constitutional:      General: He is not in acute distress.    Appearance: He is obese.  HENT:     Mouth/Throat:     Mouth: Mucous membranes are moist.  Eyes:     General: No scleral icterus.    Pupils: Pupils are equal, round, and reactive to light.  Cardiovascular:     Rate and Rhythm: Normal rate and regular rhythm.     Heart sounds: No murmur. No friction rub. No gallop.      Comments: Dorsalis pedis pulses 2+ and symmetric bilaterally  Pulmonary:     Comments: Patient pausing to catch breath between sentences.  Diminished lung sounds at bases bilaterally.   Abdominal:     General: Bowel sounds are normal.     Palpations: Abdomen is soft.  Musculoskeletal:        General: Tenderness (right dorsal foot) present.  Skin:    General: Skin is warm and dry.  Neurological:     Mental Status: He is alert and oriented to person, place, and time.  Psychiatric:        Mood and Affect: Mood normal.        LABS   LABS:  CBC Recent Labs  Lab 11/27/19 1143 11/28/19 0436 11/29/19 0508  WBC 8.3 10.3 8.9  HGB 19.4* 18.9* 18.1*  HCT 57.5* 56.8* 56.5*  PLT 121* 112* 119*   Coag's No results for input(s): APTT, INR in the last 168  hours. BMET Recent Labs  Lab 11/27/19 1143 11/28/19 0436  NA 136 136  K 4.8 4.9  CL 100 98  CO2 28 29  BUN 12 17  CREATININE 0.72 0.76  GLUCOSE 265* 258*   Electrolytes Recent Labs  Lab 11/27/19 1143 11/28/19 0436  CALCIUM 9.2 8.8*   Sepsis Markers Recent Labs  Lab 11/28/19 0436  PROCALCITON 0.16   ABG Recent  Labs  Lab 11/29/19 0442 11/29/19 0610 11/29/19 1101  PHART 7.32* 7.30* 7.33*  PCO2ART 68* 74* 73*  PO2ART 71* 90 60*   Liver Enzymes Recent Labs  Lab 11/27/19 1143 11/29/19 0509  AST 125* 66*  ALT 91* 76*  ALKPHOS 76 65  BILITOT 0.8 1.2  ALBUMIN 3.8 3.5   Cardiac Enzymes No results for input(s): TROPONINI, PROBNP in the last 168 hours. Glucose Recent Labs  Lab 11/28/19 1655 11/28/19 2129 11/29/19 0112 11/29/19 0654 11/29/19 0733 11/29/19 1158  GLUCAP 231* 195* 209* 272* 217* 198*     Recent Results (from the past 240 hour(s))  SARS CORONAVIRUS 2 (TAT 6-24 HRS) Nasopharyngeal Nasopharyngeal Swab     Status: None   Collection Time: 11/28/19  1:55 AM   Specimen: Nasopharyngeal Swab  Result Value Ref Range Status   SARS Coronavirus 2 NEGATIVE NEGATIVE Final    Comment: (NOTE) SARS-CoV-2 target nucleic acids are NOT DETECTED. The SARS-CoV-2 RNA is generally detectable in upper and lower respiratory specimens during the acute phase of infection. Negative results do not preclude SARS-CoV-2 infection, do not rule out co-infections with other pathogens, and should not be used as the sole basis for treatment or other patient management decisions. Negative results must be combined with clinical observations, patient history, and epidemiological information. The expected result is Negative. Fact Sheet for Patients: SugarRoll.be Fact Sheet for Healthcare Providers: https://www.woods-mathews.com/ This test is not yet approved or cleared by the [place] FDA and  has been authorized for detection  and/or diagnosis of SARS-CoV-2 by FDA under an Emergency Use Authorization (EUA). This EUA will remain  in effect (meaning this test can be used) for the duration of the COVID-19 declaration under Section 56 4(b)(1) of the Act, 21 U.S.C. section 360bbb-3(b)(1), unless the authorization is terminated or revoked sooner. Performed at [facility], [place] [address] [place], [place] [address]   MRSA PCR Screening     Status: None   Collection Time: 11/29/19  6:56 AM   Specimen: Nasal Mucosa; Nasopharyngeal  Result Value Ref Range Status   MRSA by PCR NEGATIVE NEGATIVE Final    Comment:        The GeneXpert MRSA Assay (FDA approved for NASAL specimens only), is one component of a comprehensive MRSA colonization surveillance program. It is not intended to diagnose MRSA infection nor to guide or monitor treatment for MRSA infections. Performed at [facility], [address], [place], [place] [address]      Current Facility-Administered Medications:  .  ARIPiprazole (ABILIFY) tablet 20 mg, 20 mg, Oral, QHS, Ivor Costa, MD, 20 mg at 11/28/19 2231 .  Chlorhexidine Gluconate Cloth 2 % PADS 6 each, 6 each, Topical, Daily, Thornell Mule, MD .  doxepin (SINEQUAN) capsule 300 mg, 300 mg, Oral, QHS, Ivor Costa, MD, 300 mg at 11/28/19 2231 .  enoxaparin (LOVENOX) injection 40 mg, 40 mg, Subcutaneous, Q24H, Ivor Costa, MD, 40 mg at 11/28/19 2230 .  insulin aspart (novoLOG) injection 0-5 Units, 0-5 Units, Subcutaneous, QHS, Ivor Costa, MD, 3 Units at 11/27/19 2258 .  insulin aspart (novoLOG) injection 0-9 Units, 0-9 Units, Subcutaneous, TID WC, Ivor Costa, MD, 2 Units at 11/29/19 1221 .  Ipratropium-Albuterol (COMBIVENT) respimat 1 puff, 1 puff, Inhalation, Q4H PRN, Ivor Costa, MD .  naloxone [facility]) injection 0.4 mg, 0.4 mg, Intravenous, PRN, Sharion Settler, NP .  ondansetron [facility]) injection 4 mg, 4 mg, Intravenous, Q8H PRN, Ivor Costa, MD, 4 mg at 11/27/19 2030 .  predniSONE (STERAPRED UNI-PAK 21 TAB) tablet 10 mg, 10 mg, Oral, PC supper, Thornell Mule, MD .  Derrill Memo ON 11/30/2019] predniSONE (STERAPRED UNI-PAK 21 TAB) tablet 10 mg, 10 mg, Oral, 3 x daily with food, Thornell Mule, MD .  Derrill Memo ON 12/01/2019] predniSONE (STERAPRED UNI-PAK 21 TAB) tablet 10 mg, 10 mg, Oral, 4X daily taper, Thornell Mule, MD .  predniSONE (STERAPRED UNI-PAK 21 TAB) tablet 20 mg, 20 mg, Oral, Nightly, Thornell Mule, MD .  Derrill Memo ON 11/30/2019] predniSONE (STERAPRED UNI-PAK 21 TAB) tablet 20 mg, 20 mg, Oral, Nightly, Thornell Mule, MD  IMAGING    DG Chest Port 1 View  Result Date: 11/29/2019 CLINICAL DATA:  54 year old male with shortness of breath. Negative for COVID-19 yesterday. Former smoker. EXAM: PORTABLE CHEST 1 VIEW COMPARISON:  Portable chest 11/27/2019. Chest CTA 11/28/2019. FINDINGS: Portable AP upright view at 0744 hours. Mildly lower lung volumes. Stable cardiac size and mediastinal contours. No pneumothorax or pulmonary edema. Patchy bibasilar opacity persists. No pleural effusion is evident. Paucity of bowel gas in the upper abdomen. No acute osseous abnormality identified. IMPRESSION: Mildly lower lung volumes with persistent bibasilar opacity which could reflect atelectasis or infection. Electronically Signed   By: Genevie Ann M.D.   On: 11/29/2019 07:56      Indwelling Urinary Catheter continued, requirement due to   Reason to continue Indwelling Urinary Catheter for strict Intake/Output monitoring for hemodynamic instability    INDWELLING DEVICES:: Urethral catheter Peripheral IV right antecubital  MICRO DATA: MRSA PCR: Negative SARS Coronavirus 2: Negative    ASSESSMENT/PLAN   Acute hypoxic Respiratory Failure secondary to resp suppression from polypharmacy with opioids and his underlying psych meds in conjunction with OSA/OHS - Right sided elevated hemidiaphragm visualized on chest x-ray.  Patient has history of obesity hyperinflation syndrome  assessed during hospitalization in October 2019.   - O2 requirement increased with oxycodone and seroquel Patient was transferred to the ICU on Bipap.  He is now tolerating 4L nasal canula with an SpO2 of 89-90%.   - Continue ipratropium-albuterol resp status improving significantly   Right foot pain - Comminuted fracture of second metatarsal of right foot with some callus formation suspicious for old fracture.   - Splint applied, non-weightbearing  Bipolar Disorder - Continue home Seroquel, doxepin and abilify  Diabetes Mellitus Type 2 - poorly controlled, HgbA1c 9.7 in ED workup - patient reports he was prescribed metformin "once," but ran out of the medication, and didn't know he should get a refill.   - Monitor blood glucose - continue Insulin aspart TID with meals  GI - Tolerating Diabetic diet - Constipation protocol as indicated.    DVT prophylaxis - Enoxaparin 40 mg  Disposition - Due to improving respiratory failure, and patient's ability to discontinue bipap, can consider returning to floor.      I have personally obtained a history, examined the patient, evaluated laboratory and independently reviewed  imaging results, formulated the assessment and plan and placed orders.  The Patient requires high complexity decision making for assessment and support, frequent evaluation and titration of therapies, application of advanced monitoring technologies and extensive interpretation of multiple databases.    Corrin Parker, M.D.  [facility]  Medical Director [place] Director [facility] Cardio-Pulmonary Department

## 2019-11-29 NOTE — Progress Notes (Signed)
Called rapid response due to not being able to wake patient up. VS were stable, O2 was between 88-90% on 5L. CBG was 209. 0.4mg  of Narcan was given at bedside. Respiratory placed patient on CPAP. Patient was also bladder scanned at >981mL. Placed a foley catheter.

## 2019-11-29 NOTE — Progress Notes (Addendum)
HPI: Patient states that he has been having right foot pain right foot swelling for more than 1 month, denies any injury. His pain is constant, sharp, 10 out of 10 severity, nonradiating.  Patient denies any shortness of breath, cough or chest pain, but he was found to have oxygen desaturation to 86% on room air in ED. No fever or chills.  No nausea, vomiting, diarrhea, abdominal pain, symptoms of UTI or unilateral weakness.  Patient states that he used to use CPAP, but stopped using CPAP approximately 2 years ago. ED Course: pt was found to have WBC 8.3, troponin V, BNP 18, negative Covid Ag test, electrolytes renal function okay, abnormal liver function (ALP 76, AST 125, ALT 91, total bilirubin 0.8), temperature normal, blood pressure 125/99, heart rate in 90s, RR 20, chest x-ray negative.  CT angiogram is negative for PE.  Right lower extremity Doppler is negative for DVT.   X-ray of right foot showed mildly comminuted fracture of the second metatarsal shaft with some callus formation suggesting subacute injury.  Dr.Cline of podiatry was consulted. Pt is placed on med-surg bed for obs.  Subjective: Overnight patient developed acute respiratory failure worsening situation for which she was needed to be placed on CPAP followed by BiPAP.  Patient was transferred to a stepdown unit as well.  This morning patient was somewhat lethargic with BiPAP on.  Objective: Vital signs in last 24 hours: Temp:  [97.6 F (36.4 C)-98.5 F (36.9 C)] 97.7 F (36.5 C) (01/05 0756) Pulse Rate:  [84-97] 86 (01/05 0900) Resp:  [10-20] 10 (01/05 0900) BP: (124-151)/(82-116) 132/116 (01/05 0900) SpO2:  [88 %-94 %] 88 % (01/05 0900) Weight:  [130.1 kg] 130.1 kg (01/05 0652)  Intake/Output from previous day: 01/04 0701 - 01/05 0700 In: 0  Out: 1235 [Urine:1235] Intake/Output this shift: No intake/output data recorded.  General:  Lethargic/sleepy with BiPAP on now HEENT:       Eyes: PERRL, EOMI, no scleral icterus.        ENT: No discharge from the ears and nose, no pharynx injection, no tonsillar enlargement.        Neck: No JVD, no bruit, no mass felt. Heme: No neck lymph node enlargement. Cardiac: S1/S2, RRR, No murmurs, No gallops or rubs. Respiratory:  Coarse breathing and mild crackles GI: Soft, nondistended, nontender, no rebound pain, no organomegaly, BS present. GU: No hematuria Ext: 1+DP/PT pulse bilaterally. Has swelling and tenderness in the right foot dorsal area. No ovelying warmth. Musculoskeletal: No joint deformities, No joint redness or warmth, no limitation of ROM in spin. Skin: No rashes.  Neuro: Lethargic Psych: Patient is not psychotic, no suicidal or hemocidal ideation.  Results for orders placed or performed during the hospital encounter of 11/27/19 (from the past 24 hour(s))  Glucose, capillary     Status: Abnormal   Collection Time: 11/28/19 12:41 PM  Result Value Ref Range   Glucose-Capillary 229 (H) 70 - 99 mg/dL   Comment 1 Notify RN   Glucose, capillary     Status: Abnormal   Collection Time: 11/28/19  4:55 PM  Result Value Ref Range   Glucose-Capillary 231 (H) 70 - 99 mg/dL  Glucose, capillary     Status: Abnormal   Collection Time: 11/28/19  9:29 PM  Result Value Ref Range   Glucose-Capillary 195 (H) 70 - 99 mg/dL  Glucose, capillary     Status: Abnormal   Collection Time: 11/29/19  1:12 AM  Result Value Ref Range   Glucose-Capillary 209 (  H) 70 - 99 mg/dL  Urinalysis, Complete w Microscopic     Status: Abnormal   Collection Time: 11/29/19  1:47 AM  Result Value Ref Range   Color, Urine YELLOW (A) YELLOW   APPearance CLEAR (A) CLEAR   Specific Gravity, Urine 1.027 1.005 - 1.030   pH 5.0 5.0 - 8.0   Glucose, UA 150 (A) NEGATIVE mg/dL   Hgb urine dipstick NEGATIVE NEGATIVE   Bilirubin Urine NEGATIVE NEGATIVE   Ketones, ur NEGATIVE NEGATIVE mg/dL   Protein, ur NEGATIVE NEGATIVE mg/dL   Nitrite NEGATIVE NEGATIVE   Leukocytes,Ua TRACE (A) NEGATIVE   RBC / HPF  0-5 0 - 5 RBC/hpf   WBC, UA 6-10 0 - 5 WBC/hpf   Bacteria, UA NONE SEEN NONE SEEN   Squamous Epithelial / LPF NONE SEEN 0 - 5   Mucus PRESENT   Blood gas, arterial     Status: Abnormal   Collection Time: 11/29/19  4:42 AM  Result Value Ref Range   FIO2 0.60    Delivery systems CONTINUOUS POSITIVE AIRWAY PRESSURE    Expiratory PAP 11    pH, Arterial 7.32 (L) 7.350 - 7.450   pCO2 arterial 68 (HH) 32.0 - 48.0 mmHg   pO2, Arterial 71 (L) 83.0 - 108.0 mmHg   Bicarbonate 35.0 (H) 20.0 - 28.0 mmol/L   Acid-Base Excess 6.5 (H) 0.0 - 2.0 mmol/L   O2 Saturation 92.6 %   Patient temperature 37.0    Collection site LEFT RADIAL    Sample type ARTERIAL DRAW    Allens test (pass/fail) PASS PASS  CBC with Differential/Platelet     Status: Abnormal   Collection Time: 11/29/19  5:08 AM  Result Value Ref Range   WBC 8.9 4.0 - 10.5 K/uL   RBC 6.01 (H) 4.22 - 5.81 MIL/uL   Hemoglobin 18.1 (H) 13.0 - 17.0 g/dL   HCT 16.1 (H) 09.6 - 04.5 %   MCV 94.0 80.0 - 100.0 fL   MCH 30.1 26.0 - 34.0 pg   MCHC 32.0 30.0 - 36.0 g/dL   RDW 40.9 81.1 - 91.4 %   Platelets 119 (L) 150 - 400 K/uL   nRBC 0.0 0.0 - 0.2 %   Neutrophils Relative % 79 %   Neutro Abs 7.0 1.7 - 7.7 K/uL   Lymphocytes Relative 14 %   Lymphs Abs 1.2 0.7 - 4.0 K/uL   Monocytes Relative 6 %   Monocytes Absolute 0.6 0.1 - 1.0 K/uL   Eosinophils Relative 1 %   Eosinophils Absolute 0.1 0.0 - 0.5 K/uL   Basophils Relative 0 %   Basophils Absolute 0.0 0.0 - 0.1 K/uL   Immature Granulocytes 0 %   Abs Immature Granulocytes 0.03 0.00 - 0.07 K/uL  Hepatic function panel     Status: Abnormal   Collection Time: 11/29/19  5:09 AM  Result Value Ref Range   Total Protein 7.1 6.5 - 8.1 g/dL   Albumin 3.5 3.5 - 5.0 g/dL   AST 66 (H) 15 - 41 U/L   ALT 76 (H) 0 - 44 U/L   Alkaline Phosphatase 65 38 - 126 U/L   Total Bilirubin 1.2 0.3 - 1.2 mg/dL   Bilirubin, Direct 0.3 (H) 0.0 - 0.2 mg/dL   Indirect Bilirubin 0.9 0.3 - 0.9 mg/dL  Blood gas,  arterial     Status: Abnormal   Collection Time: 11/29/19  6:10 AM  Result Value Ref Range   FIO2 0.65    Delivery systems BILEVEL  POSITIVE AIRWAY PRESSURE    LHR 14 resp/min   Inspiratory PAP 18    Expiratory PAP 9    pH, Arterial 7.30 (L) 7.350 - 7.450   pCO2 arterial 74 (HH) 32.0 - 48.0 mmHg   pO2, Arterial 90 83.0 - 108.0 mmHg   Bicarbonate 36.4 (H) 20.0 - 28.0 mmol/L   Acid-Base Excess 7.2 (H) 0.0 - 2.0 mmol/L   O2 Saturation 96.0 %   Patient temperature 37.0    Collection site LEFT RADIAL    Sample type ARTERIAL DRAW    Allens test (pass/fail) PASS PASS  Glucose, capillary     Status: Abnormal   Collection Time: 11/29/19  6:54 AM  Result Value Ref Range   Glucose-Capillary 272 (H) 70 - 99 mg/dL  MRSA PCR Screening     Status: None   Collection Time: 11/29/19  6:56 AM   Specimen: Nasal Mucosa; Nasopharyngeal  Result Value Ref Range   MRSA by PCR NEGATIVE NEGATIVE  Glucose, capillary     Status: Abnormal   Collection Time: 11/29/19  7:33 AM  Result Value Ref Range   Glucose-Capillary 217 (H) 70 - 99 mg/dL  Blood gas, arterial     Status: Abnormal   Collection Time: 11/29/19 11:01 AM  Result Value Ref Range   FIO2 0.36    Delivery systems NASAL CANNULA    pH, Arterial 7.33 (L) 7.350 - 7.450   pCO2 arterial 73 (HH) 32.0 - 48.0 mmHg   pO2, Arterial 60 (L) 83.0 - 108.0 mmHg   Bicarbonate 38.5 (H) 20.0 - 28.0 mmol/L   Acid-Base Excess 9.6 (H) 0.0 - 2.0 mmol/L   O2 Saturation 88.7 %   Patient temperature 37.0    Collection site RIGHT RADIAL    Sample type ARTERIAL DRAW    Allens test (pass/fail) PASS PASS    Studies/Results: CT ANGIO CHEST PE W OR WO CONTRAST  Result Date: 11/28/2019 CLINICAL DATA:  Shortness of breath and respiratory failure EXAM: CT ANGIOGRAPHY CHEST WITH CONTRAST TECHNIQUE: Multidetector CT imaging of the chest was performed using the standard protocol during bolus administration of intravenous contrast. Multiplanar CT image reconstructions and MIPs  were obtained to evaluate the vascular anatomy. CONTRAST:  30mL OMNIPAQUE IOHEXOL 350 MG/ML SOLN COMPARISON:  CT chest 11/27/2019 FINDINGS: Cardiovascular: Satisfactory opacification of the pulmonary arteries to the segmental level. No evidence of pulmonary embolism. Nonaneurysmal aorta. No dissection is seen. Heart size within normal limits. No pericardial effusion Mediastinum/Nodes: No enlarged mediastinal, hilar, or axillary lymph nodes. Thyroid gland, trachea, and esophagus demonstrate no significant findings. Lungs/Pleura: No pleural effusion or pneumothorax. Partial consolidations in the bilateral lower lobes and right middle lobe favored to represent multifocal atelectasis over pneumonia. Upper Abdomen: No acute abnormality. Musculoskeletal: No chest wall abnormality. No acute or significant osseous findings. Review of the MIP images confirms the above findings. IMPRESSION: 1. Negative for acute pulmonary embolus or aortic dissection 2. Partial consolidations within the bilateral lower lobes and right middle lobe favored to represent multifocal atelectasis over pneumonia. Electronically Signed   By: Donavan Foil M.D.   On: 11/28/2019 03:51   CT Angio Chest PE W and/or Wo Contrast  Result Date: 11/27/2019 CLINICAL DATA:  Shortness of breath, history of small cell lung carcinoma with hypoxia EXAM: CT ANGIOGRAPHY CHEST WITH CONTRAST TECHNIQUE: Multidetector CT imaging of the chest was performed using the standard protocol during bolus administration of intravenous contrast. Multiplanar CT image reconstructions and MIPs were obtained to evaluate the vascular anatomy. CONTRAST:  100mL OMNIPAQUE IOHEXOL 350 MG/ML SOLN COMPARISON:  08/30/2018 FINDINGS: Cardiovascular: Thoracic aorta demonstrates a normal branching pattern without aneurysmal dilatation or dissection. No cardiac enlargement is seen. No pericardial effusion is noted. No significant coronary calcifications are seen. The pulmonary artery shows a  normal branching pattern. No definitive filling defects are identified to suggest pulmonary emboli. Mediastinum/Nodes: Thoracic inlet is within normal limits. No hilar or mediastinal adenopathy is noted. The esophagus as visualized is within normal limits. Lungs/Pleura: The lungs are well aerated bilaterally. Minimal bibasilar atelectatic changes are noted left slightly greater than right. No focal confluent infiltrate or sizable effusion is seen. No parenchymal nodules or recurrent mass is noted. Upper Abdomen: Visualized upper abdomen demonstrates mild fatty infiltration of the liver. The gallbladder has been surgically removed. Musculoskeletal: Degenerative changes of the thoracic spine are seen. No acute bony abnormality is noted. Review of the MIP images confirms the above findings. IMPRESSION: No evidence of pulmonary emboli. Mild bibasilar atelectatic changes. Fatty liver. Electronically Signed   By: Alcide CleverMark  Lukens M.D.   On: 11/27/2019 13:15   US Venous Img Lower Unilateral Right  Result Date: 11/27/2019 CLINICAL DATA:  Right lower extremity swelling and edema for 1 month EXAM: RIGHT LOWER EXTREMITY VENOUS DOPPLER ULTRASOUND TECHNIQUE: Gray-scale sonography with graded compression, as well as color Doppler and duplex ultrasound were performed to evaluate the lower extremity deep venous systems from the level of the common femoral vein and including the common femoral, femoral, profunda femoral, popliteal and calf veins including the posterior tibial, peroneal and gastrocnemius veins when visible. The superficial great saphenous vein was also interrogated. Spectral Doppler was utilized to evaluate flow at rest and with distal augmentation maneuvers in the common femoral, femoral and popliteal veins. COMPARISON:  None. FINDINGS: Contralateral Common Femoral Vein: Respiratory phasicity is normal and symmetric with the symptomatic side. No evidence of thrombus. Normal compressibility. Common Femoral Vein: No  evidence of thrombus. Normal compressibility, respiratory phasicity and response to augmentation. Saphenofemoral Junction: No evidence of thrombus. Normal compressibility and flow on color Doppler imaging. Profunda Femoral Vein: No evidence of thrombus. Normal compressibility and flow on color Doppler imaging. Femoral Vein: No evidence of thrombus. Normal compressibility, respiratory phasicity and response to augmentation. Popliteal Vein: No evidence of thrombus. Normal compressibility, respiratory phasicity and response to augmentation. Calf Veins: No evidence of thrombus. Normal compressibility and flow on color Doppler imaging. IMPRESSION: No evidence of deep venous thrombosis. Electronically Signed   By: Judie PetitM.  Shick M.D.   On: 11/27/2019 14:09   DG Chest Port 1 View  Result Date: 11/29/2019 CLINICAL DATA:  54 year old male with shortness of breath. Negative for COVID-19 yesterday. Former smoker. EXAM: PORTABLE CHEST 1 VIEW COMPARISON:  Portable chest 11/27/2019. Chest CTA 11/28/2019. FINDINGS: Portable AP upright view at 0744 hours. Mildly lower lung volumes. Stable cardiac size and mediastinal contours. No pneumothorax or pulmonary edema. Patchy bibasilar opacity persists. No pleural effusion is evident. Paucity of bowel gas in the upper abdomen. No acute osseous abnormality identified. IMPRESSION: Mildly lower lung volumes with persistent bibasilar opacity which could reflect atelectasis or infection. Electronically Signed   By: Odessa FlemingH  Hall M.D.   On: 11/29/2019 07:56   DG Chest Portable 1 View  Result Date: 11/27/2019 CLINICAL DATA:  Short of breath and cough. EXAM: PORTABLE CHEST 1 VIEW COMPARISON:  08/30/2018 FINDINGS: Normal heart size. Short decreased lung volumes. No pleural effusion or edema. No airspace opacities identified. IMPRESSION: No active disease. Electronically Signed   By: Veronda Prudeaylor  Stroud M.D.  On: 11/27/2019 12:15   DG Foot Complete Right  Result Date: 11/27/2019 CLINICAL DATA:  No new  injury, pain, swelling and redness top of right foot. Previous fx right mid foot about 5 yrs ago. EXAM: RIGHT FOOT COMPLETE - 3+ VIEW COMPARISON:  None. FINDINGS: There is a mildly comminuted fracture in the mid shaft of the second metatarsal. There is some callus formation suggesting subacute injury. No other acute bony abnormality in the right foot. There is dorsal soft tissue swelling. IMPRESSION: Mildly comminuted fracture of the second metatarsal shaft with some callus formation suggesting subacute injury. Electronically Signed   By: Emmaline KluverNancy  Ballantyne M.D.   On: 11/27/2019 12:21   ECHOCARDIOGRAM COMPLETE  Result Date: 11/28/2019   ECHOCARDIOGRAM REPORT   Patient Name:   Randy MinksQUINTON R Velez Date of Exam: 11/28/2019 Medical Rec #:  [id number]       Height:       72.0 in Accession #:    [id number]      Weight:       280.0 lb Date of Birth:  1966-10-31       BSA:          2.46 m Patient Age:    53 years        BP:           137/89 mmHg Patient Gender: M               HR:           99 bpm. Exam Location:  [facility] Procedure: 2D Echo, Cardiac Doppler and Color Doppler Indications:     CHF- acute diastolic 428.31  History:         Patient has prior history of Echocardiogram examinations, most                  recent 09/01/2018. Risk Factors:Sleep Apnea. Bipolar disorder.  Sonographer:     Cristela BlueJerry Hege RDCS (AE) Referring Phys:  [id number] Brien FewXILIN NIU Diagnosing Phys: Lorine BearsMuhammad Arida MD IMPRESSIONS  1. Left ventricular ejection fraction, by visual estimation, is 60 to 65%. The left ventricle has normal function. There is moderately increased left ventricular septal hypertrophy.  2. Left ventricular diastolic function could not be evaluated.  3. The left ventricle has no regional wall motion abnormalities.  4. Global right ventricle has normal systolic function.The right ventricular size is normal. No increase in right ventricular wall thickness.  5. The pericardial effusion is posterior to the left ventricle.  6. Trivial pericardial  effusion is present.  7. The mitral valve is normal in structure. No evidence of mitral valve regurgitation. No evidence of mitral stenosis.  8. The tricuspid valve is normal in structure.  9. The aortic valve is normal in structure. Aortic valve regurgitation is not visualized. No evidence of aortic valve sclerosis or stenosis. 10. The pulmonic valve was normal in structure. Pulmonic valve regurgitation is not visualized. 11. TR signal is inadequate for assessing pulmonary artery systolic pressure. FINDINGS  Left Ventricle: Left ventricular ejection fraction, by visual estimation, is 60 to 65%. The left ventricle has normal function. The left ventricle has no regional wall motion abnormalities. There is moderately increased left ventricular hypertrophy. Septal left ventricular hypertrophy. Left ventricular diastolic function could not be evaluated. Normal left atrial pressure. Right Ventricle: The right ventricular size is normal. No increase in right ventricular wall thickness. Global RV systolic function is has normal systolic function. Left Atrium: Left atrial size was normal in size. Right Atrium: Right atrial  size was normal in size Pericardium: Trivial pericardial effusion is present. The pericardial effusion is posterior to the left ventricle. Mitral Valve: The mitral valve is normal in structure. No evidence of mitral valve regurgitation. No evidence of mitral valve stenosis by observation. Tricuspid Valve: The tricuspid valve is normal in structure. Tricuspid valve regurgitation is not demonstrated. Aortic Valve: The aortic valve is normal in structure. Aortic valve regurgitation is not visualized. The aortic valve is structurally normal, with no evidence of sclerosis or stenosis. Pulmonic Valve: The pulmonic valve was normal in structure. Pulmonic valve regurgitation is not visualized. Pulmonic regurgitation is not visualized. Aorta: The aortic root, ascending aorta and aortic arch are all structurally  normal, with no evidence of dilitation or obstruction. Venous: The inferior vena cava was not well visualized. IAS/Shunts: No atrial level shunt detected by color flow Doppler. There is no evidence of a patent foramen ovale. No ventricular septal defect is seen or detected. There is no evidence of an atrial septal defect.  LEFT VENTRICLE PLAX 2D LVIDd:         3.80 cm LVIDs:         2.63 cm LV PW:         0.98 cm LV IVS:        1.75 cm LVOT diam:     2.20 cm LV SV:         37 ml LV SV Index:   14.15 LVOT Area:     3.80 cm  LEFT ATRIUM         Index LA diam:    3.30 cm 1.34 cm/m                        PULMONIC VALVE AORTA                 PV Vmax:        0.91 m/s Ao Root diam: 3.80 cm PV Peak grad:   3.3 mmHg                       RVOT Peak grad: 2 mmHg   SHUNTS Systemic Diam: 2.20 cm  Lorine Bears MD Electronically signed by Lorine Bears MD Signature Date/Time: 11/28/2019/1:11:45 PM    Final     Scheduled Meds: . ARIPiprazole  20 mg Oral QHS  . Chlorhexidine Gluconate Cloth  6 each Topical Daily  . doxepin  300 mg Oral QHS  . enoxaparin (LOVENOX) injection  40 mg Subcutaneous Q24H  . insulin aspart  0-5 Units Subcutaneous QHS  . insulin aspart  0-9 Units Subcutaneous TID WC  . predniSONE  10 mg Oral PC lunch  . predniSONE  10 mg Oral PC supper  . [START ON 11/30/2019] predniSONE  10 mg Oral 3 x daily with food  . [START ON 12/01/2019] predniSONE  10 mg Oral 4X daily taper  . predniSONE  20 mg Oral AC breakfast  . predniSONE  20 mg Oral Nightly  . [START ON 11/30/2019] predniSONE  20 mg Oral Nightly   Continuous Infusions: PRN Meds:Ipratropium-Albuterol, naLOXone (NARCAN)  injection, ondansetron (ZOFRAN) IV  Assessment/Plan: Acute respiratory failure with hypoxia (HCC):  Worsened overnight as he now is requiring BiPAP  -Latest ABG shows respiratory acidosis with hypoxemia -Latest chest x-ray shows possible hiatal hernia on the right side with enlarged liver which was not present in his  imaging  done couple of years ago.  So has decreased lung volume  now - etiology is not clear.  COVID-19 antigen negative.   CT angiogram is negative for PE.  BNP 18,  does not seem to have acute CHF.  Patient has history of OSA, which may have contributed partially.   -Bronchodilators -Avoid narcotics and potential CNS suppressants - Echo shows  1. Left ventricular ejection fraction, by visual estimation, is 60 to 65%. The left ventricle has normal function. There is moderately increased left ventricular septal hypertrophy.  2. Left ventricular diastolic function could not be evaluated. -Wean down to room air as tolerated -Continue breathing treatment -On p.o. steroid taper dose started on 11/28/2019 -Consulted pulmonology, Dr. Jayme Cloud.  Went over the case and imaging/chart.  We will see the patient and provide further recommendation. -Appreciate pulmonology's input and care -When stable may transfer back to medical floor.  Closed fracture of second metatarsal bone of right foot: Dr. Alberteen Spindle of podiatry was consulted --> "Pt. will need a splint and crutches for non wt. Bearing".  -Dr. Roxan Hockey of ED will kindly help to put patient on splint and crutch - continue in the splint on the right foot with nonweightbearing using crutches.  - Podiatry recs, "Discussed consequences of weightbearing including displacement of the fracture which at this point is in good alignment.  Discussed that on his discharge we can see him in the office and switch him over to a fracture boot that he can take on and off and at that point may be place a little bit of weight on the heel only"  - Still having pain  -prn oxycodone for pain  Bipolar disorder (HCC): Stable -continue home Abilify, doxepin, Seroquel  Diabetes mellitus without complication: Last A1c 7.3, poorly controled. Patient is taking Metformin at home; hold for now -SSI  Sleep apnea: -CPAP -However patient continues to refuse it and his saturation drops when  he sleeps  Abnormal LFTs: Improved -check hepatitis panel --negative nonreactive and HIV antibody also negative nonreactive However per imaging liver shows enlarged -Avoid hepatotoxic agents -Follow-up outpatient   DVT ppx: SQ Lovenox Code Status: Full code Family Communication: None at bed side.    Disposition Plan:  Anticipate discharge back to previous home environment Consults called:  Dr. Alberteen Spindle of podiatry; Dr. Jayme Cloud with [facility] Admission status: Inpatient   LOS: 1 day   Jaynee Winters Harold Hedge

## 2019-11-29 NOTE — Progress Notes (Addendum)
Rapid response event secondary to inability to arouse patient with decreased oxygen saturation.  Patient recently received his scheduled 800 mg seroquel and oxycodone .  Patient was minimally responsive initially and sats improved with addition of supplemental oxygen, blood pressure stable and not hypoglycemic.  Patient was given narcan and some improvement in responsiveness and oxygen saturation. Patient also noted to have significant abdominal fullness.  No urineoutput documented since 1400 per nursing report. Bladder scan almost 1 liter of urine. Foley was placed and about 1 liter over hour was measured urine specimen was sent however, his urinary retention is most likely due to his medication regimen. He was placed on BIPAP 55% for additional support to allow for medications to clear. He is now able to communicate, however still lethargic. QTc on tele 450. Will need to continue tele.  In addition to seroquel and oxycodone, he is on abilify, doxepin and  dextromethorphan for cough suppressant.. His morbid obesity, with significant protruberant abdomen, OSA and sedation from daily medications  Causing small tidal volumes and chronic respiratory failure.  He has refused CPAP in the past.  His COVID screen tests are negative and CT chest findings not typical of COVID infection but more of his chronic hypoventilation and atelectasis.  His seroquel and narcotics have been discontinued. His isolation has been discontinued.   ABG after hour or so of cpap therapy shows partially compensated respiratory acidosis.  With elevated bicarb and CO2 41m suspect acute on chronic respiratory failure with hypercapnea induced by hypoventilation.  Will do BIPAP one hour and repeat blood gas. Transferring to SDU for closer monitoring  One hour blood gas with higher CO2 and pH 7.3, BIPAPsetting adjusted wit 18/9 with rate of 18.  Tidal volumes are marginally good when awake but decreased with sleep. He does arouse easily and is  following commands. [place] for repeat ABG 1hour after settled in SDU on current settings

## 2019-11-29 NOTE — Progress Notes (Signed)
Inpatient Diabetes Program Recommendations  AACE/ADA: New Consensus Statement on Inpatient Glycemic Control (2015)  Target Ranges:  Prepandial:   less than 140 mg/dL      Peak postprandial:   less than 180 mg/dL (1-2 hours)      Critically ill patients:  140 - 180 mg/dL   Lab Results  Component Value Date   GLUCAP 198 (H) 11/29/2019   HGBA1C 9.7 (H) 11/27/2019   Results for Randy Velez, Randy Velez (MRN [id number]) as of 11/29/2019 13:40  Ref. Range 11/28/2019 21:29 11/29/2019 01:12 11/29/2019 06:54 11/29/2019 07:33  Glucose-Capillary Latest Ref Range: 70 - 99 mg/dL 361 (H) 443 (H) 154 (H) 217 (H)  Diabetes history: DM 2 Outpatient Diabetes medications:  Metformin 500 mg bid Current orders for Inpatient glycemic control:  Novolog sensitive tid with meals and HS  Inpatient Diabetes Program Recommendations:    A1C indicates sub-optimal control of blood sugars prior to admit. Likely will need adjustment in home medications when d/c'd from hospital.   Please consider adding Lantus 20 units daily while patient is in the hospital.   Thanks,  Beryl Meager, RN, BC-ADM Inpatient Diabetes Coordinator Pager [phone] (8a-5p)

## 2019-11-29 NOTE — Progress Notes (Signed)
Report given to ICU Zachery Dakins, RN.

## 2019-11-29 NOTE — Progress Notes (Signed)
PT Cancellation Note  Patient Details Name: Randy Velez MRN: [id number] DOB: Oct 21, 1966   Cancelled Treatment:    Reason Eval/Treat Not Completed: Medical issues which prohibited therapy(Chart reviewed for re-attempt at patient evaluation.  Patient noted with transfer to CCU due to decline in respiratory status/level of alertness for BiPAP therapy. Per guidelines, to require new orders to resume PT services after transfer to higher level of care.  Please re-consult as medically appropriate.)   Linn Clavin H. Manson Passey, PT, DPT, NCS 11/29/19, 8:10 AM [phone]

## 2019-11-30 LAB — BASIC METABOLIC PANEL
Anion gap: 12 (ref 5–15)
BUN: 14 mg/dL (ref 6–20)
CO2: 27 mmol/L (ref 22–32)
Calcium: 8.9 mg/dL (ref 8.9–10.3)
Chloride: 99 mmol/L (ref 98–111)
Creatinine, Ser: 0.69 mg/dL (ref 0.61–1.24)
GFR calc Af Amer: >60 mL/min (ref >60)
GFR calc non Af Amer: >60 mL/min (ref >60)
Glucose, Bld: 208 mg/dL — ABNORMAL HIGH (ref 70–99)
Potassium: 4.6 mmol/L (ref 3.5–5.1)
Sodium: 138 mmol/L (ref 135–145)

## 2019-11-30 LAB — GLUCOSE, CAPILLARY
Glucose-Capillary: 202 mg/dL — ABNORMAL HIGH (ref 70–99)
Glucose-Capillary: 287 mg/dL — ABNORMAL HIGH (ref 70–99)
Glucose-Capillary: 264 mg/dL — ABNORMAL HIGH (ref 70–99)
Glucose-Capillary: 241 mg/dL — ABNORMAL HIGH (ref 70–99)

## 2019-11-30 LAB — CBC
HCT: 56.6 % — ABNORMAL HIGH (ref 39.0–52.0)
Hemoglobin: 18.5 g/dL — ABNORMAL HIGH (ref 13.0–17.0)
MCH: 30.2 pg (ref 26.0–34.0)
MCHC: 32.7 g/dL (ref 30.0–36.0)
MCV: 92.5 fL (ref 80.0–100.0)
Platelets: 143 10*3/uL — ABNORMAL LOW (ref 150–400)
RBC: 6.12 MIL/uL — ABNORMAL HIGH (ref 4.22–5.81)
RDW: 13.7 % (ref 11.5–15.5)
WBC: 11.5 10*3/uL — ABNORMAL HIGH (ref 4.0–10.5)
nRBC: 0 % (ref 0.0–0.2)

## 2019-11-30 LAB — THYROID PANEL WITH TSH
Free Thyroxine Index: 1.1 — ABNORMAL LOW (ref 1.2–4.9)
T3 Uptake Ratio: 21 % — ABNORMAL LOW (ref 24–39)
T4, Total: 5.1 ug/dL (ref 4.5–12.0)
TSH: 0.699 u[IU]/mL (ref 0.450–4.500)

## 2019-11-30 MED ORDER — INSULIN GLARGINE 100 UNIT/ML ~~LOC~~ SOLN
25.0000 [IU] | Freq: Every day | SUBCUTANEOUS | Status: DC
  Administered 2019-11-30: 25 [IU] via SUBCUTANEOUS
  Filled 2019-11-30 (×2): qty 0.25

## 2019-11-30 NOTE — Evaluation (Signed)
Physical Therapy Evaluation Patient Details Name: Randy Velez MRN: [id number] DOB: December 18, 1965 Today's Date: 11/30/2019   History of Present Illness  Patient admitted with right foot pain after falling out of bed. Fracture of second metatarsal. Found to be in respiratory distress while in ED. Patient had transfer to CCU due to unresponsiveness. PMH includes DM, bipolar, obesity.  Clinical Impression  Patient received in bed. Agrees to PT assessment. Reports Right foot pain, bandaged up. Did not require any assist with bed mobility. Min guard needed for transfer. Had some increased pain with standing and weight bearing on right foot. No post op shoe in room, notified RN. Patient's ambulation limited to 6 feet at bedside with RW and min guard. Patient will benefit from continued skilled PT while here to improve mobility and safety.       Follow Up Recommendations Home health PT    Equipment Recommendations  Rolling walker with 5" wheels    Recommendations for Other Services       Precautions / Restrictions Precautions Precautions: Fall Restrictions Weight Bearing Restrictions: No RLE Weight Bearing: Weight bearing as tolerated      Mobility  Bed Mobility Overal bed mobility: Modified Independent             General bed mobility comments: use of bed rails  Transfers Overall transfer level: Needs assistance Equipment used: Rolling walker (2 wheeled) Transfers: Sit to/from Stand Sit to Stand: Min guard            Ambulation/Gait Ambulation/Gait assistance: Min guard Gait Distance (Feet): 6 Feet Assistive device: Rolling walker (2 wheeled) Gait Pattern/deviations: Step-to pattern     General Gait Details: ambulated 3 feet to sink and back x 2, a couple of side steps along bedside.  Stairs            Wheelchair Mobility    Modified Rankin (Stroke Patients Only)       Balance Overall balance assessment: Needs assistance Sitting-balance support:  Feet supported Sitting balance-Leahy Scale: Good     Standing balance support: Bilateral upper extremity supported;During functional activity Standing balance-Leahy Scale: Good                               Pertinent Vitals/Pain Pain Assessment: Faces Faces Pain Scale: Hurts little more Pain Location: R foot Pain Descriptors / Indicators: Sore;Tender Pain Intervention(s): Monitored during session    Home Living Family/patient expects to be discharged to:: Private residence Living Arrangements: Parent Available Help at Discharge: Family Type of Home: House Home Access: Stairs to enter Entrance Stairs-Rails: Right Entrance Stairs-Number of Steps: 3 at back Home Layout: One level Home Equipment: Environmental consultant - 2 wheels      Prior Function                 Hand Dominance        Extremity/Trunk Assessment   Upper Extremity Assessment Upper Extremity Assessment: Generalized weakness    Lower Extremity Assessment Lower Extremity Assessment: Generalized weakness    Cervical / Trunk Assessment Cervical / Trunk Assessment: Normal  Communication   Communication: No difficulties  Cognition Arousal/Alertness: Awake/alert Behavior During Therapy: WFL for tasks assessed/performed Overall Cognitive Status: Within Functional Limits for tasks assessed  General Comments      Exercises     Assessment/Plan    PT Assessment Patient needs continued PT services  PT Problem List Decreased strength;Decreased activity tolerance;Decreased balance;Pain;Decreased mobility;Decreased safety awareness       PT Treatment Interventions Therapeutic exercise;DME instruction;Gait training;Balance training;Functional mobility training;Stair training;Therapeutic activities;Patient/family education    PT Goals (Current goals can be found in the Care Plan section)  Acute Rehab PT Goals Patient Stated Goal: to return  home PT Goal Formulation: With patient Time For Goal Achievement: 12/14/19 Potential to Achieve Goals: Good    Frequency Min 2X/week   Barriers to discharge Decreased caregiver support lives at home with his mother    Co-evaluation               AM-PAC PT "6 Clicks" Mobility  Outcome Measure Help needed turning from your back to your side while in a flat bed without using bedrails?: None Help needed moving from lying on your back to sitting on the side of a flat bed without using bedrails?: None Help needed moving to and from a bed to a chair (including a wheelchair)?: A Little Help needed standing up from a chair using your arms (e.g., wheelchair or bedside chair)?: A Little Help needed to walk in hospital room?: A Little Help needed climbing 3-5 steps with a railing? : A Little 6 Click Score: 20    End of Session Equipment Utilized During Treatment: Gait belt Activity Tolerance: Patient limited by pain Patient left: in bed;with call bell/phone within reach Nurse Communication: Mobility status(order for post op shoe, none in room) PT Visit Diagnosis: Unsteadiness on feet (R26.81);History of falling (Z91.81);Difficulty in walking, not elsewhere classified (R26.2);Pain Pain - Right/Left: Right Pain - part of body: Ankle and joints of foot    Time: 1330-1356 PT Time Calculation (min) (ACUTE ONLY): 26 min   Charges:   PT Evaluation $PT Eval Moderate Complexity: 1 Mod PT Treatments $Gait Training: 8-22 mins        [employer], PT, GCS 11/30/19,3:02 PM

## 2019-11-30 NOTE — Progress Notes (Signed)
PROGRESS NOTE    Randy Velez  OVF:[id number] DOB: February 27, 1966 DOA: 11/27/2019 PCP: Patient, No Pcp Per   Brief Narrative:  Patient states that he has been having right foot pain right foot swelling for more than 1 month.X-ray of right foot showed mildly comminuted fracture of the second metatarsal shaft with some callus formation suggesting subacute injury. Overnight patient developed acute hypoxic respiratory failure requiring BiPAP, most likely secondary to hypoventilation syndrome.  Subjective: Patient was feeling better when seen this morning.  Has no new complaints.  He wants to go home.  According to patient he used to have CPAP at home but now also machine and not using it for some time.  Assessment & Plan:   Principal Problem:   Acute respiratory failure with hypoxia (HCC) Active Problems:   Closed fracture of second metatarsal bone of right foot   Bipolar disorder (HCC)   Sleep apnea   Abnormal LFTs   Diabetes mellitus without complication (HCC)  Acute respiratory failure with hypoxia (HCC):  Initial ABG shows respiratory acidosis with hypoxemia most likely secondary to obesity hypoventilation syndrome.  Patient do desaturate while sleeping.  Saturation improves on waking him up. Patient is morbidly obese with history of OSA not using CPAP now. -Continue with CPAP at night. -DME CPAP also ordered.  Closed fracture of second metatarsal bone of right foot:Dr. Alberteen Spindle ofpodiatry was consulted--> "Pt. will need a splint and crutches for non wt. Bearing".  -PT recommending home health. -We will follow-up with podiatry as an outpatient.  Bipolar disorder (HCC): Stable -continue homeAbilify, doxepin, Seroquel  Diabetes mellitus without complication:Last A1c7.3, poorlycontroled. Patient is takingMetforminat home; hold for now -SSI  Sleep apnea: -CPAP -However patient continues to refuse it and his saturation drops when he sleeps  Abnormal LFTs:  Improved -checkhepatitis panel --negative nonreactive and HIV antibody also negative nonreactive However per imaging liver shows enlarged -Avoid hepatotoxic agents -Follow-up outpatient  Objective: Vitals:   11/29/19 1800 11/29/19 2204 11/30/19 0411 11/30/19 1213  BP: (!) 124/106 132/83 (!) 132/94 130/85  Pulse: 97 93 88 93  Resp: 19 20 16 17   Temp:  98.7 F (37.1 C) 97.7 F (36.5 C) 97.7 F (36.5 C)  TempSrc:  Oral Oral Oral  SpO2: 90% (!) 88% 92% 92%  Weight:      Height:        Intake/Output Summary (Last 24 hours) at 11/30/2019 1939 Last data filed at 11/30/2019 1717 Gross per 24 hour  Intake 240 ml  Output 2175 ml  Net -1935 ml   Filed Weights   11/27/19 1144 11/29/19 0652  Weight: 127 kg 130.1 kg    Examination:  General exam: Morbidly obese gentleman, appears calm and comfortable  Respiratory system: Clear to auscultation. Respiratory effort normal. Cardiovascular system: S1 & S2 heard, RRR. No JVD, murmurs, rubs, gallops or clicks. Gastrointestinal system: Soft, nontender, nondistended, bowel sounds positive. Central nervous system: Alert and oriented. No focal neurological deficits.Symmetric 5 x 5 power. Extremities: No edema, no cyanosis, pulses intact and symmetrical.  Right foot with Ace wrap. Psychiatry: Judgement and insight appear normal. Mood & affect appropriate.    DVT prophylaxis: Lovenox Code Status: Full Family Communication: Family at bedside Disposition Plan: Most likely be discharged tomorrow.  Consultants:   Podiatry  Procedures:   Antimicrobials:   Data Reviewed: I have personally reviewed following labs and imaging studies  CBC: Recent Labs  Lab 11/27/19 1143 11/28/19 0436 11/29/19 0508 11/30/19 0553  WBC 8.3 10.3 8.9 11.5*  NEUTROABS 5.4  --  7.0  --   HGB 19.4* 18.9* 18.1* 18.5*  HCT 57.5* 56.8* 56.5* 56.6*  MCV 87.8 89.9 94.0 92.5  PLT 121* 112* 119* 143*   Basic Metabolic Panel: Recent Labs  Lab 11/27/19 1143  11/28/19 0436 11/30/19 0553  NA 136 136 138  K 4.8 4.9 4.6  CL 100 98 99  CO2 28 29 27   GLUCOSE 265* 258* 208*  BUN 12 17 14   CREATININE 0.72 0.76 0.69  CALCIUM 9.2 8.8* 8.9   GFR: Estimated Creatinine Clearance: 147.2 mL/min (by C-G formula based on SCr of 0.69 mg/dL). Liver Function Tests: Recent Labs  Lab 11/27/19 1143 11/29/19 0509  AST 125* 66*  ALT 91* 76*  ALKPHOS 76 65  BILITOT 0.8 1.2  PROT 7.6 7.1  ALBUMIN 3.8 3.5   No results for input(s): LIPASE, AMYLASE in the last 168 hours. No results for input(s): AMMONIA in the last 168 hours. Coagulation Profile: No results for input(s): INR, PROTIME in the last 168 hours. Cardiac Enzymes: No results for input(s): CKTOTAL, CKMB, CKMBINDEX, TROPONINI in the last 168 hours. BNP (last 3 results) No results for input(s): PROBNP in the last 8760 hours. HbA1C: No results for input(s): HGBA1C in the last 72 hours. CBG: Recent Labs  Lab 11/29/19 1609 11/29/19 2204 11/30/19 0822 11/30/19 1210 11/30/19 1705  GLUCAP 236* 266* 202* 287* 264*   Lipid Profile: No results for input(s): CHOL, HDL, LDLCALC, TRIG, CHOLHDL, LDLDIRECT in the last 72 hours. Thyroid Function Tests: Recent Labs    11/29/19 0509  TSH 0.699  T4TOTAL 5.1   Anemia Panel: No results for input(s): VITAMINB12, FOLATE, FERRITIN, TIBC, IRON, RETICCTPCT in the last 72 hours. Sepsis Labs: Recent Labs  Lab 11/28/19 0436  PROCALCITON 0.16    Recent Results (from the past 240 hour(s))  SARS CORONAVIRUS 2 (TAT 6-24 HRS) Nasopharyngeal Nasopharyngeal Swab     Status: None   Collection Time: 11/28/19  1:55 AM   Specimen: Nasopharyngeal Swab  Result Value Ref Range Status   SARS Coronavirus 2 NEGATIVE NEGATIVE Final    Comment: (NOTE) SARS-CoV-2 target nucleic acids are NOT DETECTED. The SARS-CoV-2 RNA is generally detectable in upper and lower respiratory specimens during the acute phase of infection. Negative results do not preclude SARS-CoV-2  infection, do not rule out co-infections with other pathogens, and should not be used as the sole basis for treatment or other patient management decisions. Negative results must be combined with clinical observations, patient history, and epidemiological information. The expected result is Negative. Fact Sheet for Patients: 01/26/20 Fact Sheet for Healthcare Providers: 01/26/20 This test is not yet approved or cleared by the [employer] FDA and  has been authorized for detection and/or diagnosis of SARS-CoV-2 by FDA under an Emergency Use Authorization (EUA). This EUA will remain  in effect (meaning this test can be used) for the duration of the COVID-19 declaration under Section 56 4(b)(1) of the Act, 21 U.S.C. section 360bbb-3(b)(1), unless the authorization is terminated or revoked sooner. Performed at [facility], [address], [place], [address] [place]   MRSA PCR Screening     Status: None   Collection Time: 11/29/19  6:56 AM   Specimen: Nasal Mucosa; Nasopharyngeal  Result Value Ref Range Status   MRSA by PCR NEGATIVE NEGATIVE Final    Comment:        The GeneXpert MRSA Assay (FDA approved for NASAL specimens only), is one component of a comprehensive MRSA colonization  surveillance program. It is not intended to diagnose MRSA infection nor to guide or monitor treatment for MRSA infections. Performed at [facility], [address] [place],  [address]      Radiology Studies: DG Chest [place] 1 View  Result Date: 11/29/2019 CLINICAL DATA:  54 year old male with shortness of breath. Negative for COVID-19 yesterday. Former smoker. EXAM: PORTABLE CHEST 1 VIEW COMPARISON:  Portable chest 11/27/2019. Chest CTA 11/28/2019. FINDINGS: Portable AP upright view at 0744 hours. Mildly lower lung volumes. Stable cardiac size and mediastinal contours. No pneumothorax or pulmonary edema. Patchy  bibasilar opacity persists. No pleural effusion is evident. Paucity of bowel gas in the upper abdomen. No acute osseous abnormality identified. IMPRESSION: Mildly lower lung volumes with persistent bibasilar opacity which could reflect atelectasis or infection. Electronically Signed   By: Genevie Ann M.D.   On: 11/29/2019 07:56    Scheduled Meds: . ARIPiprazole  20 mg Oral QHS  . Chlorhexidine Gluconate Cloth  6 each Topical Daily  . doxepin  300 mg Oral QHS  . enoxaparin (LOVENOX) injection  40 mg Subcutaneous Q24H  . insulin aspart  0-5 Units Subcutaneous QHS  . insulin aspart  0-9 Units Subcutaneous TID WC  . insulin glargine  25 Units Subcutaneous QHS  . [START ON 12/01/2019] predniSONE  10 mg Oral 4X daily taper  . predniSONE  20 mg Oral Nightly   Continuous Infusions:   LOS: 2 days   Time spent: 30 minutes.  Lorella Nimrod, MD [facility] Pager [phone]  If 7PM-7AM, please contact night-coverage [email or web address] Password [email or web address] 11/30/2019, 7:39 PM   This record has been created using Dragon voice recognition software. Errors have been sought and corrected,but may not always be located. Such creation errors do not reflect on the standard of care.

## 2019-12-01 LAB — GLUCOSE, CAPILLARY
Glucose-Capillary: 168 mg/dL — ABNORMAL HIGH (ref 70–99)
Glucose-Capillary: 250 mg/dL — ABNORMAL HIGH (ref 70–99)

## 2019-12-01 MED ORDER — METFORMIN HCL 1000 MG PO TABS: 1000.0000 mg | ORAL_TABLET | Freq: Two times a day (BID) | ORAL | 11 refills | Status: DC

## 2019-12-01 MED ORDER — IPRATROPIUM-ALBUTEROL 20-100 MCG/ACT IN AERS: 1.0000 | INHALATION_SPRAY | Freq: Four times a day (QID) | RESPIRATORY_TRACT | 0 refills | Status: DC | PRN

## 2019-12-01 NOTE — Progress Notes (Addendum)
Inpatient Diabetes Program Recommendations  AACE/ADA: New Consensus Statement on Inpatient Glycemic Control (2015)  Target Ranges:  Prepandial:   less than 140 mg/dL      Peak postprandial:   less than 180 mg/dL (1-2 hours)      Critically ill patients:  140 - 180 mg/dL   Lab Results  Component Value Date   GLUCAP 250 (H) 12/01/2019   HGBA1C 9.7 (H) 11/27/2019    Review of Glycemic Control  Results for Randy Velez, Randy Velez (MRN [id number]) as of 12/01/2019 13:04  Ref. Range 11/30/2019 05:53 11/30/2019 08:22 11/30/2019 12:10 11/30/2019 17:05 11/30/2019 20:46 12/01/2019 08:02 12/01/2019 11:54  Glucose-Capillary Latest Ref Range: 70 - 99 mg/dL  409 (H) 811 (H) 914 (H) 241 (H) 168 (H) 250 (H)    Diabetes history: DM2 Outpatient Diabetes medications: None Current orders for Inpatient glycemic control: Lantus 25 units daily; Novolog 0-9 TID and 0-5 QHS; Prednisone 10 mg Taper  Inpatient Diabetes Program Recommendations:     -Please consider Novolog 3 units TID with meals if eats at least 50%  Addendum:  Spoke with patient via phone today.  Reviewed patient's current A1c of 9.7% (average BS 265). Explained what a A1c is and what it measures. Also reviewed goal A1c with patient, importance of good glucose control @ home, and blood sugar goals.  He was prescribed Metformin about a year ago and never got it refilled.  He does not have a PCP.  Pleased TOC order.  Explained Importance of taking his prescribed Metformin and the long term risks of high blood sugar.  He drinks 1-2 regular [employer] a day.  Educated on cutting out regular sugar drinks and limiting carbohydrates.  Explained what foods contain carbohydrates. He does have insurance and denies difficulty affording medical care or medication.  Will continue to follow.  .   Thank you, Zerita Boers, RN, BSN Diabetes Coordinator Inpatient Diabetes Program [phone] (team pager from 8a-5p)

## 2019-12-01 NOTE — Care Management Important Message (Signed)
Important Message  Patient Details  Name: Randy Velez MRN: [id number] Date of Birth: [birth year]   Medicare Important Message Given:  Yes  Initial Medicare IM given by Patient Access Associate on 11/30/2019 at 1352.   Johnell Comings 12/01/2019, 11:46 AM

## 2019-12-01 NOTE — Progress Notes (Signed)
RN called RT to patient bedside due to CPAP (V60) alarming continuously and SATs dropped into upper 80s. RT found CPAP circuit was disconnected at mask port. Circuit and mask reconnected. Patient assess and found to be in no respiratory distress. SATs immediately came back up to 95%. Patient resting comfortably in bed.

## 2019-12-01 NOTE — Progress Notes (Signed)
[place] R Yingst to be D/C'd Home per MD order.  Discussed prescriptions and follow up appointments with the patient. Prescriptions given to patient, medication list explained in detail. Pt verbalized understanding.  Allergies as of 12/01/2019      Reactions   Lithium    Morphine Other (See Comments)   Short of breath   Risperidone    Ziprasidone Hcl Anxiety      Medication List    TAKE these medications   ARIPiprazole 20 MG tablet Commonly known as: ABILIFY Take 20 mg by mouth at bedtime.   blood glucose meter kit and supplies Kit Dispense based on patient and insurance preference. Use up to four times daily as directed. (FOR ICD-9 250.00, 250.01).   doxepin 100 MG capsule Commonly known as: SINEQUAN Take 300 mg by mouth at bedtime.   Ipratropium-Albuterol 20-100 MCG/ACT Aers respimat Commonly known as: COMBIVENT Inhale 1 puff into the lungs every 6 (six) hours as needed for wheezing or shortness of breath.   metFORMIN 1000 MG tablet Commonly known as: Glucophage Take 1 tablet (1,000 mg total) by mouth 2 (two) times daily with a meal.   QUEtiapine 400 MG tablet Commonly known as: SEROQUEL Take 800 mg by mouth at bedtime.            Durable Medical Equipment  (From admission, onward)         Start     Ordered   12/01/19 1352  For home use only DME Walker rolling  Once    Question Answer Comment  Walker: With 5 Inch Wheels   Patient needs a walker to treat with the following condition Displaced fracture of second metatarsal bone, right foot, initial encounter for closed fracture      12/01/19 1351   12/01/19 1352  For home use only DME oxygen  Once    Question Answer Comment  Length of Need 12 Months   Mode or (Route) Nasal cannula   Liters per Minute 5   Frequency Continuous (stationary and portable oxygen unit needed)   Oxygen conserving device Yes   Oxygen delivery system Gas      12/01/19 1352   11/30/19 1229  For home use only DME continuous positive  airway pressure (CPAP)  Once    Question Answer Comment  Length of Need Lifetime   Patient has OSA or probable OSA Yes   Is the patient currently using CPAP in the home No   Settings Autotitration   Signs and symptoms of probable OSA  (select all that apply) Witnessed apneas   Signs and symptoms of probable OSA  (select all that apply) Snoring   CPAP supplies needed Mask, headgear, cushions, filters, heated tubing and water chamber      11/30/19 1229          Vitals:   12/01/19 1430 12/01/19 1438  BP:    Pulse:    Resp:    Temp:    SpO2: 90% 90%    Skin clean, dry and intact without evidence of skin break down, no evidence of skin tears noted. IV catheter discontinued intact. Site without signs and symptoms of complications. Dressing and pressure applied. Pt denies pain at this time. No complaints noted.  An After Visit Summary was printed and given to the patient. Patient escorted via WC, and D/C home via private auto.   G   12/01/2019 4:59 PM   

## 2019-12-01 NOTE — TOC Initial Note (Addendum)
Transition of Care [facility]) - Initial/Assessment Note    Patient Details  Name: Randy Velez MRN: [id number] Date of Birth: 21-Mar-1966  Transition of Care [facility]) CM/SW Contact:    Beverly Sessions, RN Phone Number: 12/01/2019, 3:24 PM  Clinical Narrative:                 Patient admitted from home with respiratory failure  Patient lives at home with mother and sister Mother provides transportation   Patient does not have PCP.  Agreeable to new patient appointment.  Scheduled for 1/11 with Evern Bio NP  Patient and mother are aware that in order to have home health services patient has to go to new patient appointment  Patient to discharge on 5L O2. And RW Referral made to [facility] with [facility].  To be delivered to room prior to discharge. Patient to be set up with outpatient sleep study in order to determine if he qualifies for CPAP   Patient agreeable to home health services.  States he does not have a preference of home health agency.  Referral made to [facility] with [place]   Expected Discharge Plan: [place]     Patient Goals and CMS Choice     Choice offered to / list presented to : Patient  Expected Discharge Plan and Services Expected Discharge Plan: [place]       Living arrangements for the past 2 months: Single Family Home Expected Discharge Date: 12/01/19               DME Arranged: Oxygen, Walker rolling DME Agency: [employer] Date DME Agency Contacted: 12/01/19 Time DME Agency Contacted: [phone] Representative spoke with at DME Agency: [place]: RN, PT, Nurse's Aide, Social Work [employer] Agency: [place] ([place]) Date [place]: 12/01/19 Time [place]: 40 Representative spoke with at [place]: [place] Arrangements/Services Living arrangements for the past 2 months: [place] Lives with:: Siblings, Parents Patient language and need for  interpreter reviewed:: Yes Do you feel safe going back to the place where you live?: Yes      Need for Family Participation in Patient Care: Yes (Comment) Care giver support system in place?: Yes (comment)   Criminal Activity/Legal Involvement Pertinent to Current Situation/Hospitalization: No - Comment as needed  Activities of Daily Living Home Assistive Devices/Equipment: None ADL Screening (condition at time of admission) Patient's cognitive ability adequate to safely complete daily activities?: Yes Is the patient deaf or have difficulty hearing?: No Does the patient have difficulty seeing, even when wearing glasses/contacts?: No Does the patient have difficulty concentrating, remembering, or making decisions?: No Patient able to express need for assistance with ADLs?: Yes Does the patient have difficulty dressing or bathing?: No Independently performs ADLs?: Yes (appropriate for developmental age) Does the patient have difficulty walking or climbing stairs?: No Weakness of Legs: None Weakness of Arms/Hands: None  Permission Sought/Granted                  Emotional Assessment Appearance:: Appears stated age Attitude/Demeanor/Rapport: Gracious Affect (typically observed): Accepting Orientation: : Oriented to Self, Oriented to Place, Oriented to  Time, Oriented to Situation   Psych Involvement: No (comment)  Admission diagnosis:  Acute respiratory failure with hypoxia (HCC) [J96.01] Closed fracture of second metatarsal bone of right foot, physeal involvement unspecified, initial encounter [G01.749S] Patient Active Problem List   Diagnosis Date Noted  . Closed  fracture of second metatarsal bone of right foot 11/27/2019  . Diabetes mellitus without complication (HCC) 11/27/2019  . Bipolar disorder (HCC)   . Sleep apnea   . Abnormal LFTs   . Acute respiratory failure with hypoxia (HCC) 08/30/2018   PCP:  Patient, No Pcp Per Pharmacy:   [facility] - [place], [place] - [address] [place] [address] Phone: [phone] Fax: [phone]  [id number], [place] - [address] AT [facility] [address] [facility] [place] [address] Phone: [phone] Fax: [phone]     Social Determinants of Health (SDOH) Interventions    Readmission Risk Interventions No flowsheet data found.

## 2019-12-01 NOTE — Progress Notes (Signed)
Physical Therapy Treatment Patient Details Name: Randy Velez MRN: [id number] DOB: 06-Mar-1966 Today's Date: 12/01/2019    History of Present Illness Patient admitted with right foot pain after falling out of bed. Fracture of second metatarsal. Found to be in respiratory distress while in ED. Patient had transfer to CCU due to unresponsiveness. PMH includes DM, bipolar, obesity.    PT Comments    Patient received in bed, RN present. RN trying to decrease O2 needs however patient sats at 90% at rest on 5.5 liters. Patient denies shortness of breath with activity. He performs bed mobility with mod independence, heavy use of bed rail and increased effort to get upright. Performed sit to stand with min guard assist. Ambulated 15 feet with RW and post op shoe donned on right foot, min guard. O2 sats at 92% after walking on 5.5 liters. He will continue to benefit from skilled PT while here to improve activity tolerance and strength.      Follow Up Recommendations  Home health PT     Equipment Recommendations  Rolling walker with 5" wheels    Recommendations for Other Services       Precautions / Restrictions Precautions Precautions: Fall Restrictions Weight Bearing Restrictions: No RLE Weight Bearing: Weight bearing as tolerated    Mobility  Bed Mobility Overal bed mobility: Modified Independent             General bed mobility comments: use of bed rails, increased effort  Transfers Overall transfer level: Needs assistance Equipment used: Rolling walker (2 wheeled) Transfers: Sit to/from Stand Sit to Stand: Min guard            Ambulation/Gait   Gait Distance (Feet): 15 Feet Assistive device: Rolling walker (2 wheeled) Gait Pattern/deviations: Step-to pattern;Wide base of support Gait velocity: decreased   General Gait Details: Patient limited mainly by respiratory status, O2 needs. He is on 5.5 liters sats were at 92% after walking.   Stairs              Wheelchair Mobility    Modified Rankin (Stroke Patients Only)       Balance Overall balance assessment: Modified Independent Sitting-balance support: Feet supported Sitting balance-Leahy Scale: Good     Standing balance support: Bilateral upper extremity supported;During functional activity Standing balance-Leahy Scale: Good                              Cognition Arousal/Alertness: Awake/alert Behavior During Therapy: WFL for tasks assessed/performed Overall Cognitive Status: Within Functional Limits for tasks assessed                                        Exercises      General Comments        Pertinent Vitals/Pain Pain Assessment: Faces Faces Pain Scale: Hurts a little bit Pain Location: R foot Pain Descriptors / Indicators: Sore;Tender Pain Intervention(s): Monitored during session    Home Living                      Prior Function            PT Goals (current goals can now be found in the care plan section) Acute Rehab PT Goals Patient Stated Goal: to return home PT Goal Formulation: With patient Time For Goal Achievement: 12/14/19 Potential to Achieve Goals:  Good Progress towards PT goals: Progressing toward goals    Frequency    Min 2X/week      PT Plan Current plan remains appropriate    Co-evaluation              AM-PAC PT "6 Clicks" Mobility   Outcome Measure  Help needed turning from your back to your side while in a flat bed without using bedrails?: None Help needed moving from lying on your back to sitting on the side of a flat bed without using bedrails?: None Help needed moving to and from a bed to a chair (including a wheelchair)?: A Little Help needed standing up from a chair using your arms (e.g., wheelchair or bedside chair)?: A Little Help needed to walk in hospital room?: A Little Help needed climbing 3-5 steps with a railing? : A Little 6 Click Score: 20    End of Session  Equipment Utilized During Treatment: Gait belt Activity Tolerance: Other (comment);Patient limited by pain(limited by O2 sats) Patient left: in bed;with bed alarm set Nurse Communication: Mobility status;Other (comment)(O2 status with walking) PT Visit Diagnosis: Unsteadiness on feet (R26.81);History of falling (Z91.81);Difficulty in walking, not elsewhere classified (R26.2);Pain Pain - Right/Left: Right Pain - part of body: Ankle and joints of foot     Time: 1120-1141 PT Time Calculation (min) (ACUTE ONLY): 21 min  Charges:  $Gait Training: 8-22 mins                     Shanetta Nicolls, PT, GCS 12/01/19,11:56 AM

## 2019-12-01 NOTE — Discharge Summary (Signed)
Physician Discharge Summary  Randy Velez FYB:[id number] DOB: [birth year] DOA: 11/27/2019  PCP: Patient, No Pcp Per  Admit date: 11/27/2019 Discharge date: 12/01/2019  Admitted From: Home Disposition: Home  Recommendations for Outpatient Follow-up:  1. Follow up with PCP in 1-2 weeks 2. Follow-up with pulmonologist. 3. Please obtain BMP/CBC in one week 4. Please follow up on the following pending results:  Home Health: Yes Equipment/Devices: Walker and oxygen Discharge Condition: Stable CODE STATUS: Full Diet recommendation: Heart Healthy / Carb Modified   Brief/Interim Summary: Randy Velez is a 54 y.o. male with medical history significant of diabetes mellitus, OSA, bipolar, insomnia, who presents with right foot pain. He was found to have a mildly comminuted fracture of second metatarsal shaft with  callus formation which was suggesting a subacute injury.  Fracture needs to be managed conservatively.  He also developed acute hypoxic respiratory failure initially requiring BiPAP, then saturating well with CPAP at night.  Thought to be most likely due to obesity hypoventilation syndrome.  Multiple ED visits and prior admissions for similar symptoms.  Patient does not follow-up with any PCP despite being told multiple times.  He really wants to go home today.  We had a long discussion regarding getting a PCP and pulmonologist.  He need to have a sleep study to qualify for CPAP.  He was desaturating on room air and was sent home on home oxygen. COVID-19 testing was negative.  CT chest with bilateral atelectasis. No pulmonary function testing on file.  Patient also has a diagnosis of diabetes.  A1c was 9.1.  His diabetes was managed with Lantus and SSI during hospitalization.  We started him on Metformin as an outpatient.  He needs to follow-up with a PCP for further management.  We continued his home medications for bipolar disorder.  Discharge Diagnoses:  Principal Problem:   Acute  respiratory failure with hypoxia (HCC) Active Problems:   Closed fracture of second metatarsal bone of right foot   Bipolar disorder (HCC)   Sleep apnea   Abnormal LFTs   Diabetes mellitus without complication [facility])   Discharge Instructions  Discharge Instructions    Diet - low sodium heart healthy   Complete by: As directed    Discharge instructions   Complete by: As directed    It was pleasure taking care of you. It is very important that you use oxygen 4 to 5 L all the time. You also need to use CPAP at home-in order to establish although services you must have a PCP and also see a pulmonologist for further management and diagnosis of your breathing issues. We are trying to make arrangements for home oxygen, in order to qualify for CPAP you have to have a sleep study, we are trying to make an appointment for you, you have to go for that appointment. Your blood sugar was high I am starting you on a medicine called Metformin, please go see your PCP as soon as possible so they can further manage your diabetes.   Increase activity slowly   Complete by: As directed      Allergies as of 12/01/2019      Reactions   Lithium    Morphine Other (See Comments)   Short of breath   Risperidone    Ziprasidone Hcl Anxiety      Medication List    TAKE these medications   ARIPiprazole 20 MG tablet Commonly known as: ABILIFY Take 20 mg by mouth at bedtime.   blood glucose  meter kit and supplies Kit Dispense based on patient and insurance preference. Use up to four times daily as directed. (FOR ICD-9 250.00, 250.01).   doxepin 100 MG capsule Commonly known as: SINEQUAN Take 300 mg by mouth at bedtime.   Ipratropium-Albuterol 20-100 MCG/ACT Aers respimat Commonly known as: COMBIVENT Inhale 1 puff into the lungs every 6 (six) hours as needed for wheezing or shortness of breath.   metFORMIN 1000 MG tablet Commonly known as: Glucophage Take 1 tablet (1,000 mg total) by mouth 2 (two)  times daily with a meal.   QUEtiapine 400 MG tablet Commonly known as: SEROQUEL Take 800 mg by mouth at bedtime.            Durable Medical Equipment  (From admission, onward)         Start     Ordered   12/01/19 1352  For home use only DME Walker rolling  Once    Question Answer Comment  Walker: With [employer]   Patient needs a walker to treat with the following condition Displaced fracture of second metatarsal bone, right foot, initial encounter for closed fracture      12/01/19 1351   12/01/19 1352  For home use only DME oxygen  Once    Question Answer Comment  Length of Need 12 Months   Mode or (Route) Nasal cannula   Liters per Minute 5   Frequency Continuous (stationary and portable oxygen unit needed)   Oxygen conserving device Yes   Oxygen delivery system Gas      12/01/19 1352   11/30/19 1229  For home use only DME continuous positive airway pressure (CPAP)  Once    Question Answer Comment  Length of Need Lifetime   Patient has OSA or probable OSA Yes   Is the patient currently using CPAP in the home No   Settings Autotitration   Signs and symptoms of probable OSA  (select all that apply) Witnessed apneas   Signs and symptoms of probable OSA  (select all that apply) Snoring   CPAP supplies needed Mask, headgear, cushions, filters, heated tubing and water chamber      11/30/19 1229         Follow-up Information    [facility]. Schedule an appointment as soon as possible for a visit.   Why: new patient appointment  Contact information: [place] [address] [phone]          Allergies  Allergen Reactions  . Lithium   . Morphine Other (See Comments)    Short of breath  . Risperidone   . Ziprasidone Hcl Anxiety    Consultations:  PCCM  Procedures/Studies: CT ANGIO CHEST PE W OR WO CONTRAST  Result Date: 11/28/2019 CLINICAL DATA:  Shortness of breath and respiratory failure EXAM: CT ANGIOGRAPHY  CHEST WITH CONTRAST TECHNIQUE: Multidetector CT imaging of the chest was performed using the standard protocol during bolus administration of intravenous contrast. Multiplanar CT image reconstructions and MIPs were obtained to evaluate the vascular anatomy. CONTRAST:  44m OMNIPAQUE IOHEXOL 350 MG/ML SOLN COMPARISON:  CT chest 11/27/2019 FINDINGS: Cardiovascular: Satisfactory opacification of the pulmonary arteries to the segmental level. No evidence of pulmonary embolism. Nonaneurysmal aorta. No dissection is seen. Heart size within normal limits. No pericardial effusion Mediastinum/Nodes: No enlarged mediastinal, hilar, or axillary lymph nodes. Thyroid gland, trachea, and esophagus demonstrate no significant findings. Lungs/Pleura: No pleural effusion or pneumothorax. Partial consolidations in the bilateral lower lobes and right middle lobe  favored to represent multifocal atelectasis over pneumonia. Upper Abdomen: No acute abnormality. Musculoskeletal: No chest wall abnormality. No acute or significant osseous findings. Review of the MIP images confirms the above findings. IMPRESSION: 1. Negative for acute pulmonary embolus or aortic dissection 2. Partial consolidations within the bilateral lower lobes and right middle lobe favored to represent multifocal atelectasis over pneumonia. Electronically Signed   By: Donavan Foil M.D.   On: 11/28/2019 03:51   CT Angio Chest PE W and/or Wo Contrast  Result Date: 11/27/2019 CLINICAL DATA:  Shortness of breath, history of small cell lung carcinoma with hypoxia EXAM: CT ANGIOGRAPHY CHEST WITH CONTRAST TECHNIQUE: Multidetector CT imaging of the chest was performed using the standard protocol during bolus administration of intravenous contrast. Multiplanar CT image reconstructions and MIPs were obtained to evaluate the vascular anatomy. CONTRAST:  117m OMNIPAQUE IOHEXOL 350 MG/ML SOLN COMPARISON:  08/30/2018 FINDINGS: Cardiovascular: Thoracic aorta demonstrates a normal  branching pattern without aneurysmal dilatation or dissection. No cardiac enlargement is seen. No pericardial effusion is noted. No significant coronary calcifications are seen. The pulmonary artery shows a normal branching pattern. No definitive filling defects are identified to suggest pulmonary emboli. Mediastinum/Nodes: Thoracic inlet is within normal limits. No hilar or mediastinal adenopathy is noted. The esophagus as visualized is within normal limits. Lungs/Pleura: The lungs are well aerated bilaterally. Minimal bibasilar atelectatic changes are noted left slightly greater than right. No focal confluent infiltrate or sizable effusion is seen. No parenchymal nodules or recurrent mass is noted. Upper Abdomen: Visualized upper abdomen demonstrates mild fatty infiltration of the liver. The gallbladder has been surgically removed. Musculoskeletal: Degenerative changes of the thoracic spine are seen. No acute bony abnormality is noted. Review of the MIP images confirms the above findings. IMPRESSION: No evidence of pulmonary emboli. Mild bibasilar atelectatic changes. Fatty liver. Electronically Signed   By: MInez CatalinaM.D.   On: 11/27/2019 13:15   UKoreaVenous Img Lower Unilateral Right  Result Date: 11/27/2019 CLINICAL DATA:  Right lower extremity swelling and edema for 1 month EXAM: RIGHT LOWER EXTREMITY VENOUS DOPPLER ULTRASOUND TECHNIQUE: Gray-scale sonography with graded compression, as well as color Doppler and duplex ultrasound were performed to evaluate the lower extremity deep venous systems from the level of the common femoral vein and including the common femoral, femoral, profunda femoral, popliteal and calf veins including the posterior tibial, peroneal and gastrocnemius veins when visible. The superficial great saphenous vein was also interrogated. Spectral Doppler was utilized to evaluate flow at rest and with distal augmentation maneuvers in the common femoral, femoral and popliteal veins.  COMPARISON:  None. FINDINGS: Contralateral Common Femoral Vein: Respiratory phasicity is normal and symmetric with the symptomatic side. No evidence of thrombus. Normal compressibility. Common Femoral Vein: No evidence of thrombus. Normal compressibility, respiratory phasicity and response to augmentation. Saphenofemoral Junction: No evidence of thrombus. Normal compressibility and flow on color Doppler imaging. Profunda Femoral Vein: No evidence of thrombus. Normal compressibility and flow on color Doppler imaging. Femoral Vein: No evidence of thrombus. Normal compressibility, respiratory phasicity and response to augmentation. Popliteal Vein: No evidence of thrombus. Normal compressibility, respiratory phasicity and response to augmentation. Calf Veins: No evidence of thrombus. Normal compressibility and flow on color Doppler imaging. IMPRESSION: No evidence of deep venous thrombosis. Electronically Signed   By: MJerilynn Mages  Shick M.D.   On: 11/27/2019 14:09   DG Chest Port 1 View  Result Date: 11/29/2019 CLINICAL DATA:  54year old male with shortness of breath. Negative for COVID-19 yesterday. Former smoker. EXAM:  PORTABLE CHEST 1 VIEW COMPARISON:  Portable chest 11/27/2019. Chest CTA 11/28/2019. FINDINGS: Portable AP upright view at 0744 hours. Mildly lower lung volumes. Stable cardiac size and mediastinal contours. No pneumothorax or pulmonary edema. Patchy bibasilar opacity persists. No pleural effusion is evident. Paucity of bowel gas in the upper abdomen. No acute osseous abnormality identified. IMPRESSION: Mildly lower lung volumes with persistent bibasilar opacity which could reflect atelectasis or infection. Electronically Signed   By: Genevie Ann M.D.   On: 11/29/2019 07:56   DG Chest Portable 1 View  Result Date: 11/27/2019 CLINICAL DATA:  Short of breath and cough. EXAM: PORTABLE CHEST 1 VIEW COMPARISON:  08/30/2018 FINDINGS: Normal heart size. Short decreased lung volumes. No pleural effusion or edema. No  airspace opacities identified. IMPRESSION: No active disease. Electronically Signed   By: Kerby Moors M.D.   On: 11/27/2019 12:15   DG Foot Complete Right  Result Date: 11/27/2019 CLINICAL DATA:  No new injury, pain, swelling and redness top of right foot. Previous fx right mid foot about 5 yrs ago. EXAM: RIGHT FOOT COMPLETE - 3+ VIEW COMPARISON:  None. FINDINGS: There is a mildly comminuted fracture in the mid shaft of the second metatarsal. There is some callus formation suggesting subacute injury. No other acute bony abnormality in the right foot. There is dorsal soft tissue swelling. IMPRESSION: Mildly comminuted fracture of the second metatarsal shaft with some callus formation suggesting subacute injury. Electronically Signed   By: Audie Pinto M.D.   On: 11/27/2019 12:21   ECHOCARDIOGRAM COMPLETE  Result Date: 11/28/2019   ECHOCARDIOGRAM REPORT   Patient Name:   Randy Velez Date of Exam: 11/28/2019 Medical Rec #:  [id number]       Height:       72.0 in Accession #:    [id number]      Weight:       280.0 lb Date of Birth:  11/27/1965       BSA:          2.46 m Patient Age:    54 years        BP:           137/89 mmHg Patient Gender: M               HR:           99 bpm. Exam Location:  [facility] Procedure: 2D Echo, Cardiac Doppler and Color Doppler Indications:     CHF- acute diastolic 741.28  History:         Patient has prior history of Echocardiogram examinations, most                  recent 09/01/2018. Risk Factors:Sleep Apnea. Bipolar disorder.  Sonographer:     Sherrie Sport RDCS (AE) Referring Phys:  [id number] Soledad Gerlach NIU Diagnosing Phys: Kathlyn Sacramento MD IMPRESSIONS  1. Left ventricular ejection fraction, by visual estimation, is 60 to 65%. The left ventricle has normal function. There is moderately increased left ventricular septal hypertrophy.  2. Left ventricular diastolic function could not be evaluated.  3. The left ventricle has no regional wall motion abnormalities.  4. Global right ventricle  has normal systolic function.The right ventricular size is normal. No increase in right ventricular wall thickness.  5. The pericardial effusion is posterior to the left ventricle.  6. Trivial pericardial effusion is present.  7. The mitral valve is normal in structure. No evidence of mitral valve regurgitation. No evidence of mitral stenosis.  8.  The tricuspid valve is normal in structure.  9. The aortic valve is normal in structure. Aortic valve regurgitation is not visualized. No evidence of aortic valve sclerosis or stenosis. 10. The pulmonic valve was normal in structure. Pulmonic valve regurgitation is not visualized. 11. TR signal is inadequate for assessing pulmonary artery systolic pressure. FINDINGS  Left Ventricle: Left ventricular ejection fraction, by visual estimation, is 60 to 65%. The left ventricle has normal function. The left ventricle has no regional wall motion abnormalities. There is moderately increased left ventricular hypertrophy. Septal left ventricular hypertrophy. Left ventricular diastolic function could not be evaluated. Normal left atrial pressure. Right Ventricle: The right ventricular size is normal. No increase in right ventricular wall thickness. Global RV systolic function is has normal systolic function. Left Atrium: Left atrial size was normal in size. Right Atrium: Right atrial size was normal in size Pericardium: Trivial pericardial effusion is present. The pericardial effusion is posterior to the left ventricle. Mitral Valve: The mitral valve is normal in structure. No evidence of mitral valve regurgitation. No evidence of mitral valve stenosis by observation. Tricuspid Valve: The tricuspid valve is normal in structure. Tricuspid valve regurgitation is not demonstrated. Aortic Valve: The aortic valve is normal in structure. Aortic valve regurgitation is not visualized. The aortic valve is structurally normal, with no evidence of sclerosis or stenosis. Pulmonic Valve: The  pulmonic valve was normal in structure. Pulmonic valve regurgitation is not visualized. Pulmonic regurgitation is not visualized. Aorta: The aortic root, ascending aorta and aortic arch are all structurally normal, with no evidence of dilitation or obstruction. Venous: The inferior vena cava was not well visualized. IAS/Shunts: No atrial level shunt detected by color flow Doppler. There is no evidence of a patent foramen ovale. No ventricular septal defect is seen or detected. There is no evidence of an atrial septal defect.  LEFT VENTRICLE PLAX 2D LVIDd:         3.80 cm LVIDs:         2.63 cm LV PW:         0.98 cm LV IVS:        1.75 cm LVOT diam:     2.20 cm LV SV:         37 ml LV SV Index:   14.15 LVOT Area:     3.80 cm  LEFT ATRIUM         Index LA diam:    3.30 cm 1.34 cm/m                        PULMONIC VALVE AORTA                 PV Vmax:        0.91 m/s Ao Root diam: 3.80 cm PV Peak grad:   3.3 mmHg                       RVOT Peak grad: 2 mmHg   SHUNTS Systemic Diam: 2.20 cm  Kathlyn Sacramento MD Electronically signed by Kathlyn Sacramento MD Signature Date/Time: 11/28/2019/1:11:45 PM    Final     Subjective: Patient was feeling better when seen this morning.  He really wants to move go back home today.  We explained to him that he needs to use oxygen all the time and see a PCP in order to facilitate home health services and further management of his chronic issues.  Discharge Exam: Vitals:  12/01/19 1148 12/01/19 1156  BP:  131/80  Pulse:  90  Resp:  18  Temp:  98.5 F (36.9 C)  SpO2: 90% 93%   Vitals:   12/01/19 1127 12/01/19 1129 12/01/19 1148 12/01/19 1156  BP:    131/80  Pulse:    90  Resp:    18  Temp:    98.5 F (36.9 C)  TempSrc:    Oral  SpO2: 90% 91% 90% 93%  Weight:      Height:        General: Pt is alert, awake, morbidly obese, not in acute distress Cardiovascular: RRR, S1/S2 +, no rubs, no gallops Respiratory: CTA bilaterally, no wheezing, no rhonchi Abdominal:  Soft, NT, ND, bowel sounds + Extremities: no edema, no cyanosis   The results of significant diagnostics from this hospitalization (including imaging, microbiology, ancillary and laboratory) are listed below for reference.    Microbiology: Recent Results (from the past 240 hour(s))  SARS CORONAVIRUS 2 (TAT 6-24 HRS) Nasopharyngeal Nasopharyngeal Swab     Status: None   Collection Time: 11/28/19  1:55 AM   Specimen: Nasopharyngeal Swab  Result Value Ref Range Status   SARS Coronavirus 2 NEGATIVE NEGATIVE Final    Comment: (NOTE) SARS-CoV-2 target nucleic acids are NOT DETECTED. The SARS-CoV-2 RNA is generally detectable in upper and lower respiratory specimens during the acute phase of infection. Negative results do not preclude SARS-CoV-2 infection, do not rule out co-infections with other pathogens, and should not be used as the sole basis for treatment or other patient management decisions. Negative results must be combined with clinical observations, patient history, and epidemiological information. The expected result is Negative. Fact Sheet for Patients: SugarRoll.be Fact Sheet for Healthcare Providers: https://www.woods-mathews.com/ This test is not yet approved or cleared by the [place] FDA and  has been authorized for detection and/or diagnosis of SARS-CoV-2 by FDA under an Emergency Use Authorization (EUA). This EUA will remain  in effect (meaning this test can be used) for the duration of the COVID-19 declaration under Section 56 4(b)(1) of the Act, 21 U.S.C. section 360bbb-3(b)(1), unless the authorization is terminated or revoked sooner. Performed at [facility], [place] [address], [place], [place] [address]   MRSA PCR Screening     Status: None   Collection Time: 11/29/19  6:56 AM   Specimen: Nasal Mucosa; Nasopharyngeal  Result Value Ref Range Status   MRSA by PCR NEGATIVE NEGATIVE Final    Comment:        The  GeneXpert MRSA Assay (FDA approved for NASAL specimens only), is one component of a comprehensive MRSA colonization surveillance program. It is not intended to diagnose MRSA infection nor to guide or monitor treatment for MRSA infections. Performed at [facility], [place] [address] [place], [place] [address]      Labs: BNP (last 3 results) Recent Labs    11/27/19 1143  BNP 81.8   Basic Metabolic Panel: Recent Labs  Lab 11/27/19 1143 11/28/19 0436 11/30/19 0553  NA 136 136 138  K 4.8 4.9 4.6  CL 100 98 99  CO2 _0 GLUCOSE 265* 258* 208*  BUN _1 CREATININE 0.72 0.76 0.69  CALCIUM 9.2 8.8* 8.9   Liver Function Tests: Recent Labs  Lab 11/27/19 1143 11/29/19 0509  AST 125* 66*  ALT 91* 76*  ALKPHOS 76 65  BILITOT 0.8 1.2  PROT 7.6 7.1  ALBUMIN 3.8 3.5   No results for input(s):  LIPASE, AMYLASE in the last 168 hours. No results for input(s): AMMONIA in the last 168 hours. CBC: Recent Labs  Lab 11/27/19 1143 11/28/19 0436 11/29/19 0508 11/30/19 0553  WBC 8.3 10.3 8.9 11.5*  NEUTROABS 5.4  --  7.0  --   HGB 19.4* 18.9* 18.1* 18.5*  HCT 57.5* 56.8* 56.5* 56.6*  MCV 87.8 89.9 94.0 92.5  PLT 121* 112* 119* 143*   Cardiac Enzymes: No results for input(s): CKTOTAL, CKMB, CKMBINDEX, TROPONINI in the last 168 hours. BNP: Invalid input(s): POCBNP CBG: Recent Labs  Lab 11/30/19 1210 11/30/19 1705 11/30/19 2046 12/01/19 0802 12/01/19 1154  GLUCAP 287* 264* 241* 168* 250*   D-Dimer No results for input(s): DDIMER in the last 72 hours. Hgb A1c No results for input(s): HGBA1C in the last 72 hours. Lipid Profile No results for input(s): CHOL, HDL, LDLCALC, TRIG, CHOLHDL, LDLDIRECT in the last 72 hours. Thyroid function studies Recent Labs    11/29/19 0509  TSH 0.699  T4TOTAL 5.1   Anemia work up No results for input(s): VITAMINB12, FOLATE, FERRITIN, TIBC, IRON, RETICCTPCT in the last 72 hours. Urinalysis    Component Value  Date/Time   COLORURINE YELLOW (A) 11/29/2019 0147   APPEARANCEUR CLEAR (A) 11/29/2019 0147   APPEARANCEUR Hazy 12/31/2011 0005   LABSPEC 1.027 11/29/2019 0147   LABSPEC 1.016 12/31/2011 0005   PHURINE 5.0 11/29/2019 0147   GLUCOSEU 150 (A) 11/29/2019 0147   GLUCOSEU 50 mg/dL 12/31/2011 0005   HGBUR NEGATIVE 11/29/2019 0147   BILIRUBINUR NEGATIVE 11/29/2019 0147   BILIRUBINUR Negative 12/31/2011 0005   KETONESUR NEGATIVE 11/29/2019 0147   PROTEINUR NEGATIVE 11/29/2019 0147   UROBILINOGEN 0.2 09/28/2007 1814   NITRITE NEGATIVE 11/29/2019 0147   LEUKOCYTESUR TRACE (A) 11/29/2019 0147   LEUKOCYTESUR Trace 12/31/2011 0005   Sepsis Labs Invalid input(s): PROCALCITONIN,  WBC,  LACTICIDVEN Microbiology Recent Results (from the past 240 hour(s))  SARS CORONAVIRUS 2 (TAT 6-24 HRS) Nasopharyngeal Nasopharyngeal Swab     Status: None   Collection Time: 11/28/19  1:55 AM   Specimen: Nasopharyngeal Swab  Result Value Ref Range Status   SARS Coronavirus 2 NEGATIVE NEGATIVE Final    Comment: (NOTE) SARS-CoV-2 target nucleic acids are NOT DETECTED. The SARS-CoV-2 RNA is generally detectable in upper and lower respiratory specimens during the acute phase of infection. Negative results do not preclude SARS-CoV-2 infection, do not rule out co-infections with other pathogens, and should not be used as the sole basis for treatment or other patient management decisions. Negative results must be combined with clinical observations, patient history, and epidemiological information. The expected result is Negative. Fact Sheet for Patients: SugarRoll.be Fact Sheet for Healthcare Providers: https://www.woods-mathews.com/ This test is not yet approved or cleared by the [place] FDA and  has been authorized for detection and/or diagnosis of SARS-CoV-2 by FDA under an Emergency Use Authorization (EUA). This EUA will remain  in effect (meaning this test can  be used) for the duration of the COVID-19 declaration under Section 56 4(b)(1) of the Act, 21 U.S.C. section 360bbb-3(b)(1), unless the authorization is terminated or revoked sooner. Performed at [facility], [place] [address], [place], [place] [address]   MRSA PCR Screening     Status: None   Collection Time: 11/29/19  6:56 AM   Specimen: Nasal Mucosa; Nasopharyngeal  Result Value Ref Range Status   MRSA by PCR NEGATIVE NEGATIVE Final    Comment:        The GeneXpert MRSA Assay (FDA approved for  NASAL specimens only), is one component of a comprehensive MRSA colonization surveillance program. It is not intended to diagnose MRSA infection nor to guide or monitor treatment for MRSA infections. Performed at [facility], [address] [place],  [address]     Time coordinating discharge: Over 30 minutes  SIGNED:  Lorella Nimrod, MD  [facility] 12/01/2019, 2:00 PM Pager [phone]  If 7PM-7AM, please contact night-coverage [email or web address] Password [email or web address]  This record has been created using Systems analyst. Errors have been sought and corrected,but may not always be located. Such creation errors do not reflect on the standard of care.

## 2019-12-01 NOTE — Progress Notes (Signed)
Checked patient's O2 saturation at rest and with exertion. The results are as follows:  Room air at rest: 86% Room air with exertion: 85% 3L nasal cannula with exertion: 88% 5.5L nasal cannula with exertion: 90%  Patient is resting now with 5.5 liters of oxygen via nasal cannula and an O2 saturation of 90%. MD aware.  Leonides Cave, RN 12/01/2019 2:44 PM

## 2019-12-01 NOTE — Progress Notes (Signed)
Pt gave good effort with bedside PFT.  FEV1= 1.36 33.4% of predicted FVC=1.62 30.6% of predicted FEV1/FVC= 83.7 108.8% of predicted

## 2019-12-05 DIAGNOSIS — E559 Vitamin D deficiency, unspecified: Secondary | ICD-10-CM | POA: Diagnosis not present

## 2019-12-05 DIAGNOSIS — G4733 Obstructive sleep apnea (adult) (pediatric): Secondary | ICD-10-CM | POA: Diagnosis not present

## 2019-12-05 DIAGNOSIS — F209 Schizophrenia, unspecified: Secondary | ICD-10-CM | POA: Diagnosis not present

## 2019-12-05 DIAGNOSIS — E119 Type 2 diabetes mellitus without complications: Secondary | ICD-10-CM | POA: Diagnosis not present

## 2019-12-05 DIAGNOSIS — M84374D Stress fracture, right foot, subsequent encounter for fracture with routine healing: Secondary | ICD-10-CM | POA: Diagnosis not present

## 2019-12-07 DIAGNOSIS — Z8701 Personal history of pneumonia (recurrent): Secondary | ICD-10-CM | POA: Diagnosis not present

## 2019-12-07 DIAGNOSIS — Z794 Long term (current) use of insulin: Secondary | ICD-10-CM | POA: Diagnosis not present

## 2019-12-07 DIAGNOSIS — W06XXXD Fall from bed, subsequent encounter: Secondary | ICD-10-CM | POA: Diagnosis not present

## 2019-12-07 DIAGNOSIS — Z9981 Dependence on supplemental oxygen: Secondary | ICD-10-CM | POA: Diagnosis not present

## 2019-12-07 DIAGNOSIS — Z9049 Acquired absence of other specified parts of digestive tract: Secondary | ICD-10-CM | POA: Diagnosis not present

## 2019-12-07 DIAGNOSIS — Z87891 Personal history of nicotine dependence: Secondary | ICD-10-CM | POA: Diagnosis not present

## 2019-12-07 DIAGNOSIS — S92321D Displaced fracture of second metatarsal bone, right foot, subsequent encounter for fracture with routine healing: Secondary | ICD-10-CM | POA: Diagnosis not present

## 2019-12-07 DIAGNOSIS — G4733 Obstructive sleep apnea (adult) (pediatric): Secondary | ICD-10-CM | POA: Diagnosis not present

## 2019-12-07 DIAGNOSIS — J9601 Acute respiratory failure with hypoxia: Secondary | ICD-10-CM | POA: Diagnosis not present

## 2019-12-07 DIAGNOSIS — S92321K Displaced fracture of second metatarsal bone, right foot, subsequent encounter for fracture with nonunion: Secondary | ICD-10-CM | POA: Diagnosis not present

## 2019-12-07 DIAGNOSIS — Z6837 Body mass index (BMI) 37.0-37.9, adult: Secondary | ICD-10-CM | POA: Diagnosis not present

## 2019-12-07 DIAGNOSIS — E662 Morbid (severe) obesity with alveolar hypoventilation: Secondary | ICD-10-CM | POA: Diagnosis not present

## 2019-12-07 DIAGNOSIS — G4700 Insomnia, unspecified: Secondary | ICD-10-CM | POA: Diagnosis not present

## 2019-12-07 DIAGNOSIS — Z9181 History of falling: Secondary | ICD-10-CM | POA: Diagnosis not present

## 2019-12-07 DIAGNOSIS — E1165 Type 2 diabetes mellitus with hyperglycemia: Secondary | ICD-10-CM | POA: Diagnosis not present

## 2019-12-08 DIAGNOSIS — S92321D Displaced fracture of second metatarsal bone, right foot, subsequent encounter for fracture with routine healing: Secondary | ICD-10-CM | POA: Diagnosis not present

## 2019-12-08 DIAGNOSIS — G4733 Obstructive sleep apnea (adult) (pediatric): Secondary | ICD-10-CM | POA: Diagnosis not present

## 2019-12-08 DIAGNOSIS — J9601 Acute respiratory failure with hypoxia: Secondary | ICD-10-CM | POA: Diagnosis not present

## 2019-12-08 DIAGNOSIS — Z6837 Body mass index (BMI) 37.0-37.9, adult: Secondary | ICD-10-CM | POA: Diagnosis not present

## 2019-12-08 DIAGNOSIS — E1165 Type 2 diabetes mellitus with hyperglycemia: Secondary | ICD-10-CM | POA: Diagnosis not present

## 2019-12-08 DIAGNOSIS — E662 Morbid (severe) obesity with alveolar hypoventilation: Secondary | ICD-10-CM | POA: Diagnosis not present

## 2019-12-09 DIAGNOSIS — S92321A Displaced fracture of second metatarsal bone, right foot, initial encounter for closed fracture: Secondary | ICD-10-CM | POA: Diagnosis not present

## 2019-12-14 DIAGNOSIS — E1165 Type 2 diabetes mellitus with hyperglycemia: Secondary | ICD-10-CM | POA: Diagnosis not present

## 2019-12-14 DIAGNOSIS — S92321D Displaced fracture of second metatarsal bone, right foot, subsequent encounter for fracture with routine healing: Secondary | ICD-10-CM | POA: Diagnosis not present

## 2019-12-14 DIAGNOSIS — Z6837 Body mass index (BMI) 37.0-37.9, adult: Secondary | ICD-10-CM | POA: Diagnosis not present

## 2019-12-14 DIAGNOSIS — E662 Morbid (severe) obesity with alveolar hypoventilation: Secondary | ICD-10-CM | POA: Diagnosis not present

## 2019-12-14 DIAGNOSIS — G4733 Obstructive sleep apnea (adult) (pediatric): Secondary | ICD-10-CM | POA: Diagnosis not present

## 2019-12-14 DIAGNOSIS — J9601 Acute respiratory failure with hypoxia: Secondary | ICD-10-CM | POA: Diagnosis not present

## 2019-12-15 DIAGNOSIS — E662 Morbid (severe) obesity with alveolar hypoventilation: Secondary | ICD-10-CM | POA: Diagnosis not present

## 2019-12-15 DIAGNOSIS — G4733 Obstructive sleep apnea (adult) (pediatric): Secondary | ICD-10-CM | POA: Diagnosis not present

## 2019-12-15 DIAGNOSIS — J9601 Acute respiratory failure with hypoxia: Secondary | ICD-10-CM | POA: Diagnosis not present

## 2019-12-15 DIAGNOSIS — Z6837 Body mass index (BMI) 37.0-37.9, adult: Secondary | ICD-10-CM | POA: Diagnosis not present

## 2019-12-15 DIAGNOSIS — E1165 Type 2 diabetes mellitus with hyperglycemia: Secondary | ICD-10-CM | POA: Diagnosis not present

## 2019-12-15 DIAGNOSIS — S92321D Displaced fracture of second metatarsal bone, right foot, subsequent encounter for fracture with routine healing: Secondary | ICD-10-CM | POA: Diagnosis not present

## 2019-12-16 DIAGNOSIS — J9601 Acute respiratory failure with hypoxia: Secondary | ICD-10-CM | POA: Diagnosis not present

## 2019-12-16 DIAGNOSIS — S92321D Displaced fracture of second metatarsal bone, right foot, subsequent encounter for fracture with routine healing: Secondary | ICD-10-CM | POA: Diagnosis not present

## 2019-12-16 DIAGNOSIS — Z6837 Body mass index (BMI) 37.0-37.9, adult: Secondary | ICD-10-CM | POA: Diagnosis not present

## 2019-12-16 DIAGNOSIS — G4733 Obstructive sleep apnea (adult) (pediatric): Secondary | ICD-10-CM | POA: Diagnosis not present

## 2019-12-16 DIAGNOSIS — E662 Morbid (severe) obesity with alveolar hypoventilation: Secondary | ICD-10-CM | POA: Diagnosis not present

## 2019-12-16 DIAGNOSIS — E1165 Type 2 diabetes mellitus with hyperglycemia: Secondary | ICD-10-CM | POA: Diagnosis not present

## 2019-12-22 DIAGNOSIS — S92321D Displaced fracture of second metatarsal bone, right foot, subsequent encounter for fracture with routine healing: Secondary | ICD-10-CM | POA: Diagnosis not present

## 2019-12-22 DIAGNOSIS — G4733 Obstructive sleep apnea (adult) (pediatric): Secondary | ICD-10-CM | POA: Diagnosis not present

## 2019-12-22 DIAGNOSIS — J9601 Acute respiratory failure with hypoxia: Secondary | ICD-10-CM | POA: Diagnosis not present

## 2019-12-22 DIAGNOSIS — E1165 Type 2 diabetes mellitus with hyperglycemia: Secondary | ICD-10-CM | POA: Diagnosis not present

## 2019-12-22 DIAGNOSIS — E662 Morbid (severe) obesity with alveolar hypoventilation: Secondary | ICD-10-CM | POA: Diagnosis not present

## 2019-12-22 DIAGNOSIS — Z6837 Body mass index (BMI) 37.0-37.9, adult: Secondary | ICD-10-CM | POA: Diagnosis not present

## 2019-12-26 DIAGNOSIS — G4733 Obstructive sleep apnea (adult) (pediatric): Secondary | ICD-10-CM | POA: Diagnosis not present

## 2019-12-26 DIAGNOSIS — F209 Schizophrenia, unspecified: Secondary | ICD-10-CM | POA: Diagnosis not present

## 2019-12-26 DIAGNOSIS — E119 Type 2 diabetes mellitus without complications: Secondary | ICD-10-CM | POA: Diagnosis not present

## 2019-12-26 DIAGNOSIS — J449 Chronic obstructive pulmonary disease, unspecified: Secondary | ICD-10-CM | POA: Diagnosis not present

## 2019-12-27 DIAGNOSIS — J9601 Acute respiratory failure with hypoxia: Secondary | ICD-10-CM | POA: Diagnosis not present

## 2019-12-27 DIAGNOSIS — E662 Morbid (severe) obesity with alveolar hypoventilation: Secondary | ICD-10-CM | POA: Diagnosis not present

## 2019-12-27 DIAGNOSIS — E1165 Type 2 diabetes mellitus with hyperglycemia: Secondary | ICD-10-CM | POA: Diagnosis not present

## 2019-12-27 DIAGNOSIS — Z6837 Body mass index (BMI) 37.0-37.9, adult: Secondary | ICD-10-CM | POA: Diagnosis not present

## 2019-12-27 DIAGNOSIS — G4733 Obstructive sleep apnea (adult) (pediatric): Secondary | ICD-10-CM | POA: Diagnosis not present

## 2019-12-27 DIAGNOSIS — S92321D Displaced fracture of second metatarsal bone, right foot, subsequent encounter for fracture with routine healing: Secondary | ICD-10-CM | POA: Diagnosis not present

## 2019-12-29 DIAGNOSIS — J9601 Acute respiratory failure with hypoxia: Secondary | ICD-10-CM | POA: Diagnosis not present

## 2019-12-29 DIAGNOSIS — G4733 Obstructive sleep apnea (adult) (pediatric): Secondary | ICD-10-CM | POA: Diagnosis not present

## 2019-12-29 DIAGNOSIS — E662 Morbid (severe) obesity with alveolar hypoventilation: Secondary | ICD-10-CM | POA: Diagnosis not present

## 2019-12-29 DIAGNOSIS — Z6837 Body mass index (BMI) 37.0-37.9, adult: Secondary | ICD-10-CM | POA: Diagnosis not present

## 2019-12-29 DIAGNOSIS — S92321D Displaced fracture of second metatarsal bone, right foot, subsequent encounter for fracture with routine healing: Secondary | ICD-10-CM | POA: Diagnosis not present

## 2019-12-29 DIAGNOSIS — E1165 Type 2 diabetes mellitus with hyperglycemia: Secondary | ICD-10-CM | POA: Diagnosis not present

## 2020-01-04 DIAGNOSIS — E1165 Type 2 diabetes mellitus with hyperglycemia: Secondary | ICD-10-CM | POA: Diagnosis not present

## 2020-01-04 DIAGNOSIS — E662 Morbid (severe) obesity with alveolar hypoventilation: Secondary | ICD-10-CM | POA: Diagnosis not present

## 2020-01-04 DIAGNOSIS — Z6837 Body mass index (BMI) 37.0-37.9, adult: Secondary | ICD-10-CM | POA: Diagnosis not present

## 2020-01-04 DIAGNOSIS — S92321D Displaced fracture of second metatarsal bone, right foot, subsequent encounter for fracture with routine healing: Secondary | ICD-10-CM | POA: Diagnosis not present

## 2020-01-04 DIAGNOSIS — J9601 Acute respiratory failure with hypoxia: Secondary | ICD-10-CM | POA: Diagnosis not present

## 2020-01-04 DIAGNOSIS — G4733 Obstructive sleep apnea (adult) (pediatric): Secondary | ICD-10-CM | POA: Diagnosis not present

## 2020-01-06 DIAGNOSIS — S92321A Displaced fracture of second metatarsal bone, right foot, initial encounter for closed fracture: Secondary | ICD-10-CM | POA: Diagnosis not present

## 2020-01-06 DIAGNOSIS — W06XXXD Fall from bed, subsequent encounter: Secondary | ICD-10-CM | POA: Diagnosis not present

## 2020-01-06 DIAGNOSIS — E1165 Type 2 diabetes mellitus with hyperglycemia: Secondary | ICD-10-CM | POA: Diagnosis not present

## 2020-01-06 DIAGNOSIS — Z87891 Personal history of nicotine dependence: Secondary | ICD-10-CM | POA: Diagnosis not present

## 2020-01-06 DIAGNOSIS — G4700 Insomnia, unspecified: Secondary | ICD-10-CM | POA: Diagnosis not present

## 2020-01-06 DIAGNOSIS — E662 Morbid (severe) obesity with alveolar hypoventilation: Secondary | ICD-10-CM | POA: Diagnosis not present

## 2020-01-06 DIAGNOSIS — Z8701 Personal history of pneumonia (recurrent): Secondary | ICD-10-CM | POA: Diagnosis not present

## 2020-01-06 DIAGNOSIS — Z9181 History of falling: Secondary | ICD-10-CM | POA: Diagnosis not present

## 2020-01-06 DIAGNOSIS — G4733 Obstructive sleep apnea (adult) (pediatric): Secondary | ICD-10-CM | POA: Diagnosis not present

## 2020-01-06 DIAGNOSIS — J9601 Acute respiratory failure with hypoxia: Secondary | ICD-10-CM | POA: Diagnosis not present

## 2020-01-06 DIAGNOSIS — S92321D Displaced fracture of second metatarsal bone, right foot, subsequent encounter for fracture with routine healing: Secondary | ICD-10-CM | POA: Diagnosis not present

## 2020-01-06 DIAGNOSIS — Z9049 Acquired absence of other specified parts of digestive tract: Secondary | ICD-10-CM | POA: Diagnosis not present

## 2020-01-06 DIAGNOSIS — Z9981 Dependence on supplemental oxygen: Secondary | ICD-10-CM | POA: Diagnosis not present

## 2020-01-06 DIAGNOSIS — Z794 Long term (current) use of insulin: Secondary | ICD-10-CM | POA: Diagnosis not present

## 2020-01-06 DIAGNOSIS — Z6837 Body mass index (BMI) 37.0-37.9, adult: Secondary | ICD-10-CM | POA: Diagnosis not present

## 2020-01-16 DIAGNOSIS — F209 Schizophrenia, unspecified: Secondary | ICD-10-CM | POA: Diagnosis not present

## 2020-01-16 DIAGNOSIS — G4733 Obstructive sleep apnea (adult) (pediatric): Secondary | ICD-10-CM | POA: Diagnosis not present

## 2020-01-16 DIAGNOSIS — J449 Chronic obstructive pulmonary disease, unspecified: Secondary | ICD-10-CM | POA: Diagnosis not present

## 2020-01-16 DIAGNOSIS — E119 Type 2 diabetes mellitus without complications: Secondary | ICD-10-CM | POA: Diagnosis not present

## 2020-01-17 DIAGNOSIS — G4733 Obstructive sleep apnea (adult) (pediatric): Secondary | ICD-10-CM | POA: Diagnosis not present

## 2020-01-17 DIAGNOSIS — S92321D Displaced fracture of second metatarsal bone, right foot, subsequent encounter for fracture with routine healing: Secondary | ICD-10-CM | POA: Diagnosis not present

## 2020-01-17 DIAGNOSIS — Z6837 Body mass index (BMI) 37.0-37.9, adult: Secondary | ICD-10-CM | POA: Diagnosis not present

## 2020-01-17 DIAGNOSIS — E1165 Type 2 diabetes mellitus with hyperglycemia: Secondary | ICD-10-CM | POA: Diagnosis not present

## 2020-01-17 DIAGNOSIS — J9601 Acute respiratory failure with hypoxia: Secondary | ICD-10-CM | POA: Diagnosis not present

## 2020-01-17 DIAGNOSIS — E662 Morbid (severe) obesity with alveolar hypoventilation: Secondary | ICD-10-CM | POA: Diagnosis not present

## 2020-01-26 DIAGNOSIS — E1165 Type 2 diabetes mellitus with hyperglycemia: Secondary | ICD-10-CM | POA: Diagnosis not present

## 2020-01-26 DIAGNOSIS — Z6837 Body mass index (BMI) 37.0-37.9, adult: Secondary | ICD-10-CM | POA: Diagnosis not present

## 2020-01-26 DIAGNOSIS — G4733 Obstructive sleep apnea (adult) (pediatric): Secondary | ICD-10-CM | POA: Diagnosis not present

## 2020-01-26 DIAGNOSIS — S92321D Displaced fracture of second metatarsal bone, right foot, subsequent encounter for fracture with routine healing: Secondary | ICD-10-CM | POA: Diagnosis not present

## 2020-01-26 DIAGNOSIS — J9601 Acute respiratory failure with hypoxia: Secondary | ICD-10-CM | POA: Diagnosis not present

## 2020-01-26 DIAGNOSIS — E662 Morbid (severe) obesity with alveolar hypoventilation: Secondary | ICD-10-CM | POA: Diagnosis not present

## 2020-02-03 DIAGNOSIS — G4733 Obstructive sleep apnea (adult) (pediatric): Secondary | ICD-10-CM | POA: Diagnosis not present

## 2020-02-13 DIAGNOSIS — E119 Type 2 diabetes mellitus without complications: Secondary | ICD-10-CM | POA: Diagnosis not present

## 2020-02-13 DIAGNOSIS — R010 Benign and innocent cardiac murmurs: Secondary | ICD-10-CM | POA: Diagnosis not present

## 2020-02-13 DIAGNOSIS — J449 Chronic obstructive pulmonary disease, unspecified: Secondary | ICD-10-CM | POA: Diagnosis not present

## 2020-02-13 DIAGNOSIS — G4733 Obstructive sleep apnea (adult) (pediatric): Secondary | ICD-10-CM | POA: Diagnosis not present

## 2020-02-13 DIAGNOSIS — R011 Cardiac murmur, unspecified: Secondary | ICD-10-CM | POA: Diagnosis not present

## 2020-02-13 DIAGNOSIS — R5383 Other fatigue: Secondary | ICD-10-CM | POA: Diagnosis not present

## 2020-03-15 DIAGNOSIS — F209 Schizophrenia, unspecified: Secondary | ICD-10-CM | POA: Diagnosis not present

## 2020-03-15 DIAGNOSIS — R5383 Other fatigue: Secondary | ICD-10-CM | POA: Diagnosis not present

## 2020-03-15 DIAGNOSIS — J449 Chronic obstructive pulmonary disease, unspecified: Secondary | ICD-10-CM | POA: Diagnosis not present

## 2020-03-15 DIAGNOSIS — G4733 Obstructive sleep apnea (adult) (pediatric): Secondary | ICD-10-CM | POA: Diagnosis not present

## 2020-03-15 DIAGNOSIS — E119 Type 2 diabetes mellitus without complications: Secondary | ICD-10-CM | POA: Diagnosis not present

## 2020-03-15 DIAGNOSIS — R011 Cardiac murmur, unspecified: Secondary | ICD-10-CM | POA: Diagnosis not present

## 2020-03-15 DIAGNOSIS — E559 Vitamin D deficiency, unspecified: Secondary | ICD-10-CM | POA: Diagnosis not present

## 2020-06-04 DIAGNOSIS — Z23 Encounter for immunization: Secondary | ICD-10-CM | POA: Diagnosis not present

## 2020-06-21 DIAGNOSIS — E559 Vitamin D deficiency, unspecified: Secondary | ICD-10-CM | POA: Diagnosis not present

## 2020-06-21 DIAGNOSIS — F209 Schizophrenia, unspecified: Secondary | ICD-10-CM | POA: Diagnosis not present

## 2020-06-21 DIAGNOSIS — J449 Chronic obstructive pulmonary disease, unspecified: Secondary | ICD-10-CM | POA: Diagnosis not present

## 2020-06-21 DIAGNOSIS — G4733 Obstructive sleep apnea (adult) (pediatric): Secondary | ICD-10-CM | POA: Diagnosis not present

## 2020-06-21 DIAGNOSIS — E119 Type 2 diabetes mellitus without complications: Secondary | ICD-10-CM | POA: Diagnosis not present

## 2020-06-21 DIAGNOSIS — R5383 Other fatigue: Secondary | ICD-10-CM | POA: Diagnosis not present

## 2020-06-21 DIAGNOSIS — E663 Overweight: Secondary | ICD-10-CM | POA: Diagnosis not present

## 2020-07-05 DIAGNOSIS — Z23 Encounter for immunization: Secondary | ICD-10-CM | POA: Diagnosis not present

## 2020-09-27 DIAGNOSIS — J449 Chronic obstructive pulmonary disease, unspecified: Secondary | ICD-10-CM | POA: Diagnosis not present

## 2020-09-27 DIAGNOSIS — E119 Type 2 diabetes mellitus without complications: Secondary | ICD-10-CM | POA: Diagnosis not present

## 2020-09-27 DIAGNOSIS — G4733 Obstructive sleep apnea (adult) (pediatric): Secondary | ICD-10-CM | POA: Diagnosis not present

## 2020-09-27 DIAGNOSIS — E785 Hyperlipidemia, unspecified: Secondary | ICD-10-CM | POA: Diagnosis not present

## 2020-09-27 DIAGNOSIS — F209 Schizophrenia, unspecified: Secondary | ICD-10-CM | POA: Diagnosis not present

## 2021-02-14 IMAGING — CT CT ANGIO CHEST
2 of 6 series · 18 of 46 positions shown · IV contrast (APPLIED)
Comparison: 08/30/2018

CLINICAL DATA: Shortness of breath, history of small cell lung
carcinoma with hypoxia

EXAM:
CT ANGIOGRAPHY CHEST WITH CONTRAST
TECHNIQUE: Multidetector CT imaging of the chest was performed using the
standard protocol during bolus administration of intravenous
contrast. Multiplanar CT image reconstructions and MIPs were
obtained to evaluate the vascular anatomy.
CONTRAST:  100mL OMNIPAQUE IOHEXOL 350 MG/ML SOLN

[Series 5: thins · axial · 0.65mm/px · z∈[-336,-64]mm · 15 of 298 slices shown]
[im 13/298  lung]
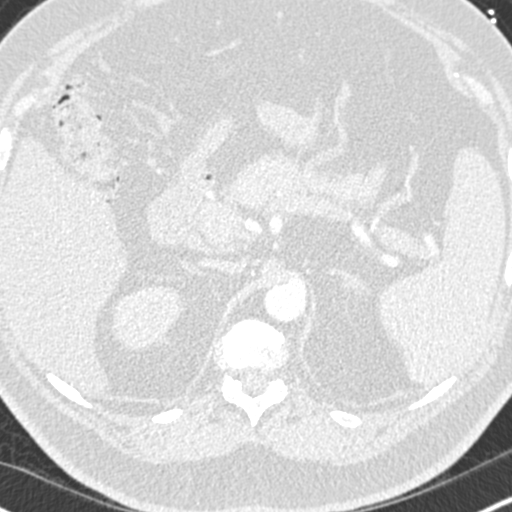
[im 39/298  soft-tissue]
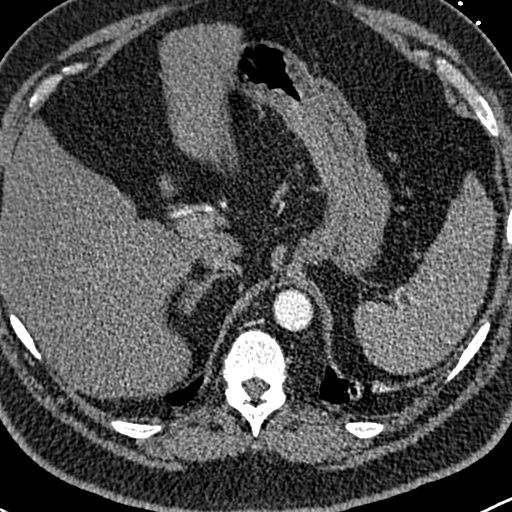
[im 52/298  lung]
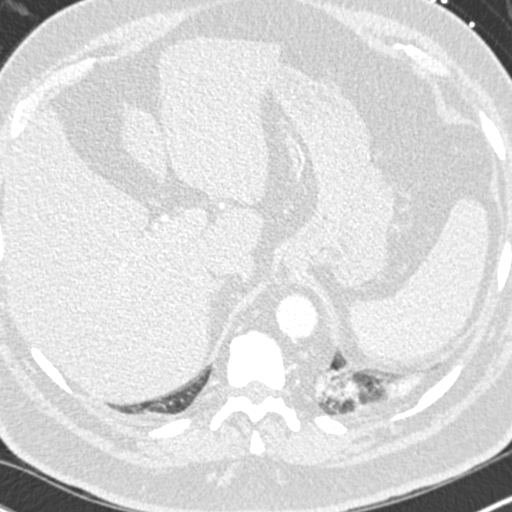
[im 78/298  soft-tissue]
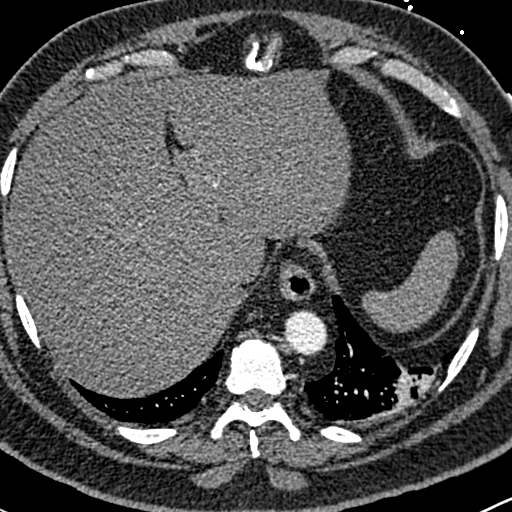
[im 91/298  lung]
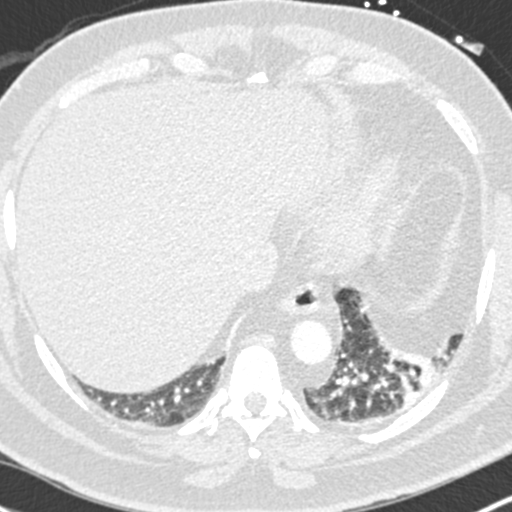
[im 117/298  soft-tissue]
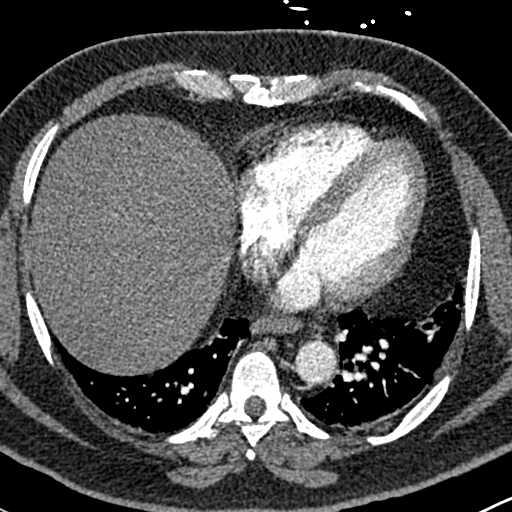
[im 130/298  lung]
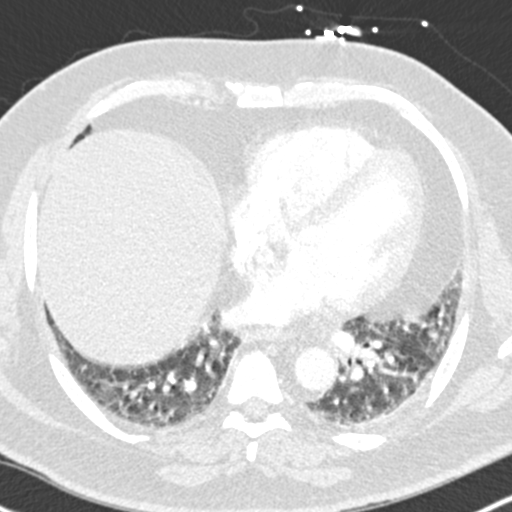
[im 155/298  soft-tissue]
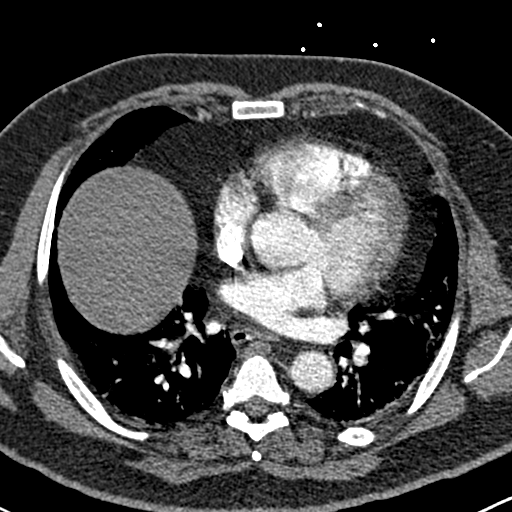
[im 168/298  lung]
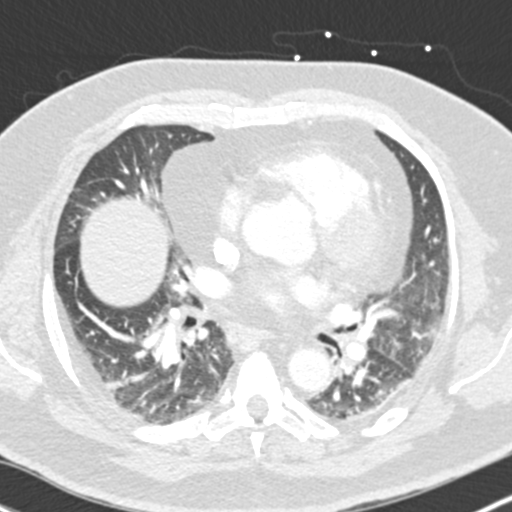
[im 181/298  soft-tissue]
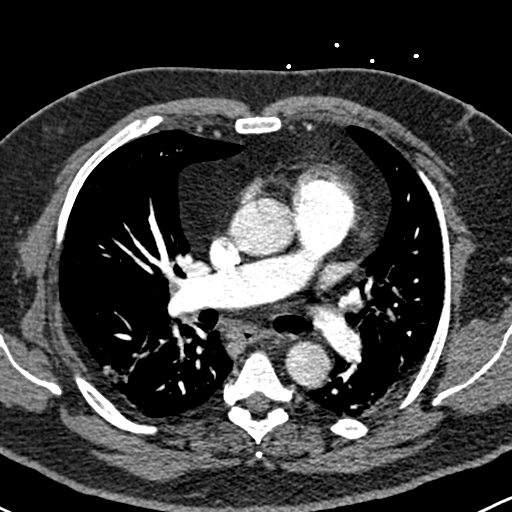
[im 207/298  lung]
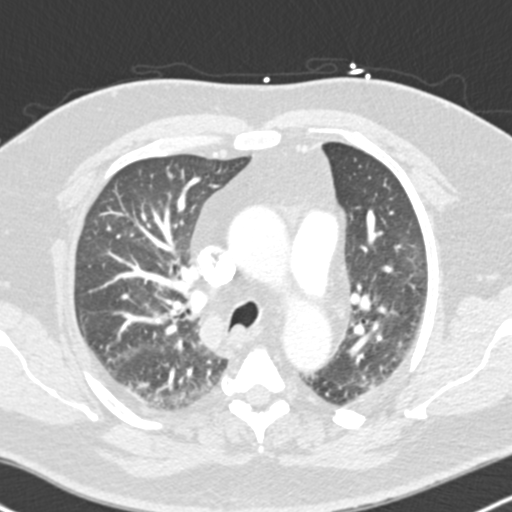
[im 220/298  soft-tissue]
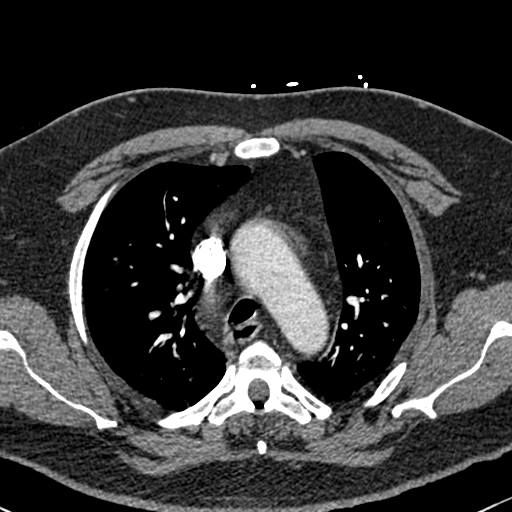
[im 246/298  lung]
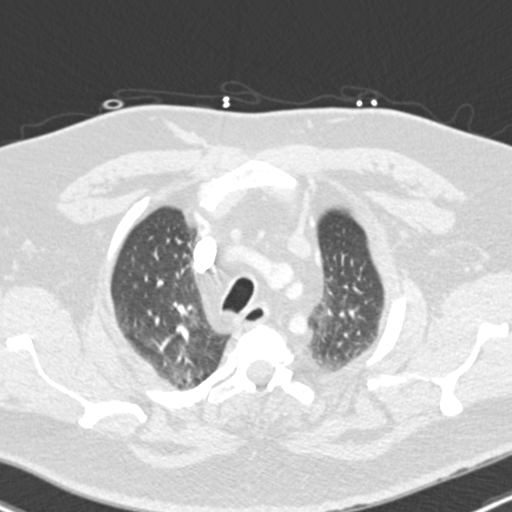
[im 259/298  soft-tissue]
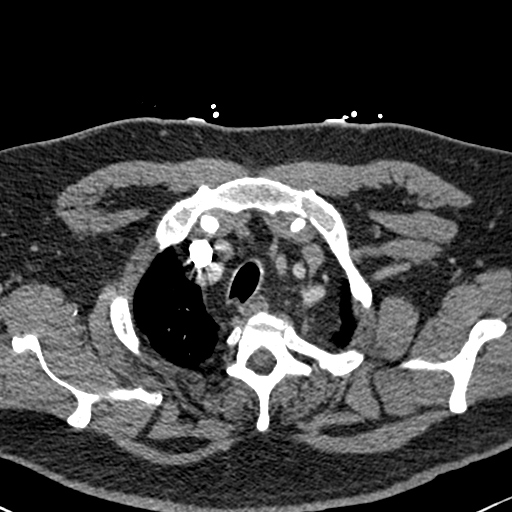
[im 285/298  lung]
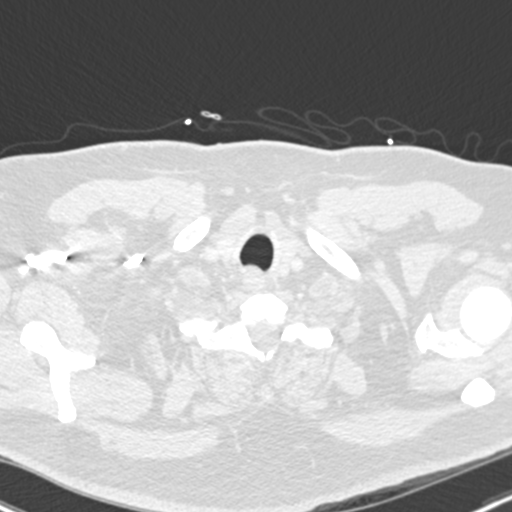

[Series 7: coronal mpr · coronal · 0.58mm/px · 3 of 106 slices shown]
[im 27/106  soft-tissue]
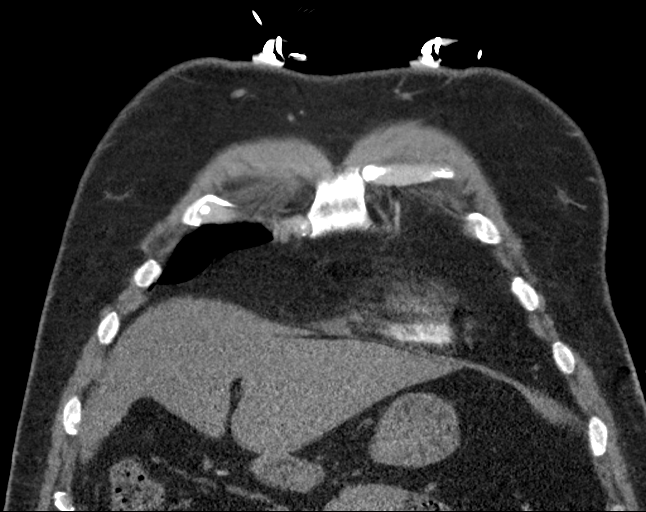
[im 53/106  soft-tissue]
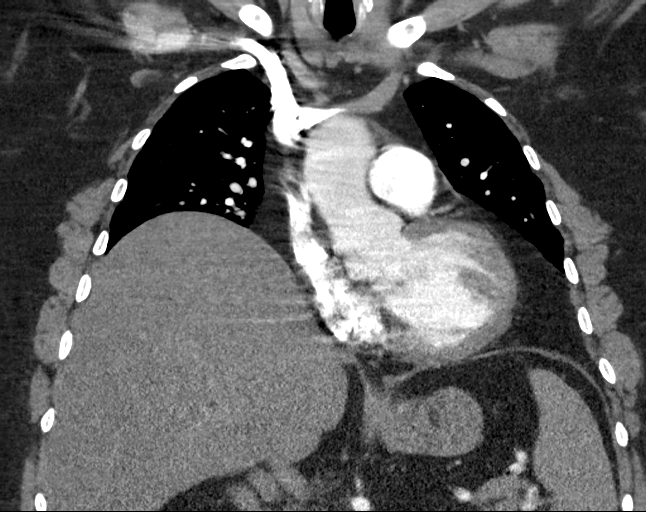
[im 79/106  soft-tissue]
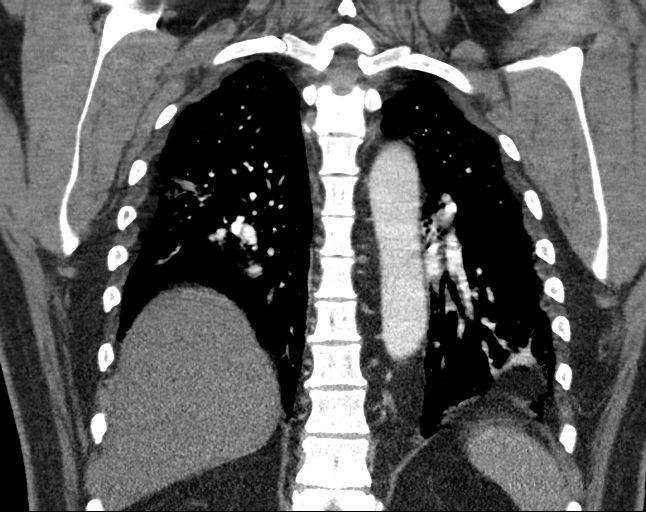

[18 of 46 positions shown; findings below may reference images not displayed]

FINDINGS: Cardiovascular: Thoracic aorta demonstrates a normal branching
pattern without aneurysmal dilatation or dissection. No cardiac
enlargement is seen. No pericardial effusion is noted. No
significant coronary calcifications are seen. The pulmonary artery
shows a normal branching pattern. No definitive filling defects are
identified to suggest pulmonary emboli.

Mediastinum/Nodes: Thoracic inlet is within normal limits. No hilar
or mediastinal adenopathy is noted. The esophagus as visualized is
within normal limits.

Lungs/Pleura: The lungs are well aerated bilaterally. Minimal
bibasilar atelectatic changes are noted left slightly greater than
right. No focal confluent infiltrate or sizable effusion is seen. No
parenchymal nodules or recurrent mass is noted.

Upper Abdomen: Visualized upper abdomen demonstrates mild fatty
infiltration of the liver. The gallbladder has been surgically
removed.

Musculoskeletal: Degenerative changes of the thoracic spine are
seen. No acute bony abnormality is noted.

Review of the MIP images confirms the above findings.
IMPRESSION: No evidence of pulmonary emboli.

Mild bibasilar atelectatic changes.

Fatty liver.

## 2021-02-14 IMAGING — DX DG CHEST 1V PORT
1 series · 1 of 1 positions shown · non-contrast
Comparison: 08/30/2018

CLINICAL DATA: Short of breath and cough.

EXAM:
PORTABLE CHEST 1 VIEW

[chest ap]
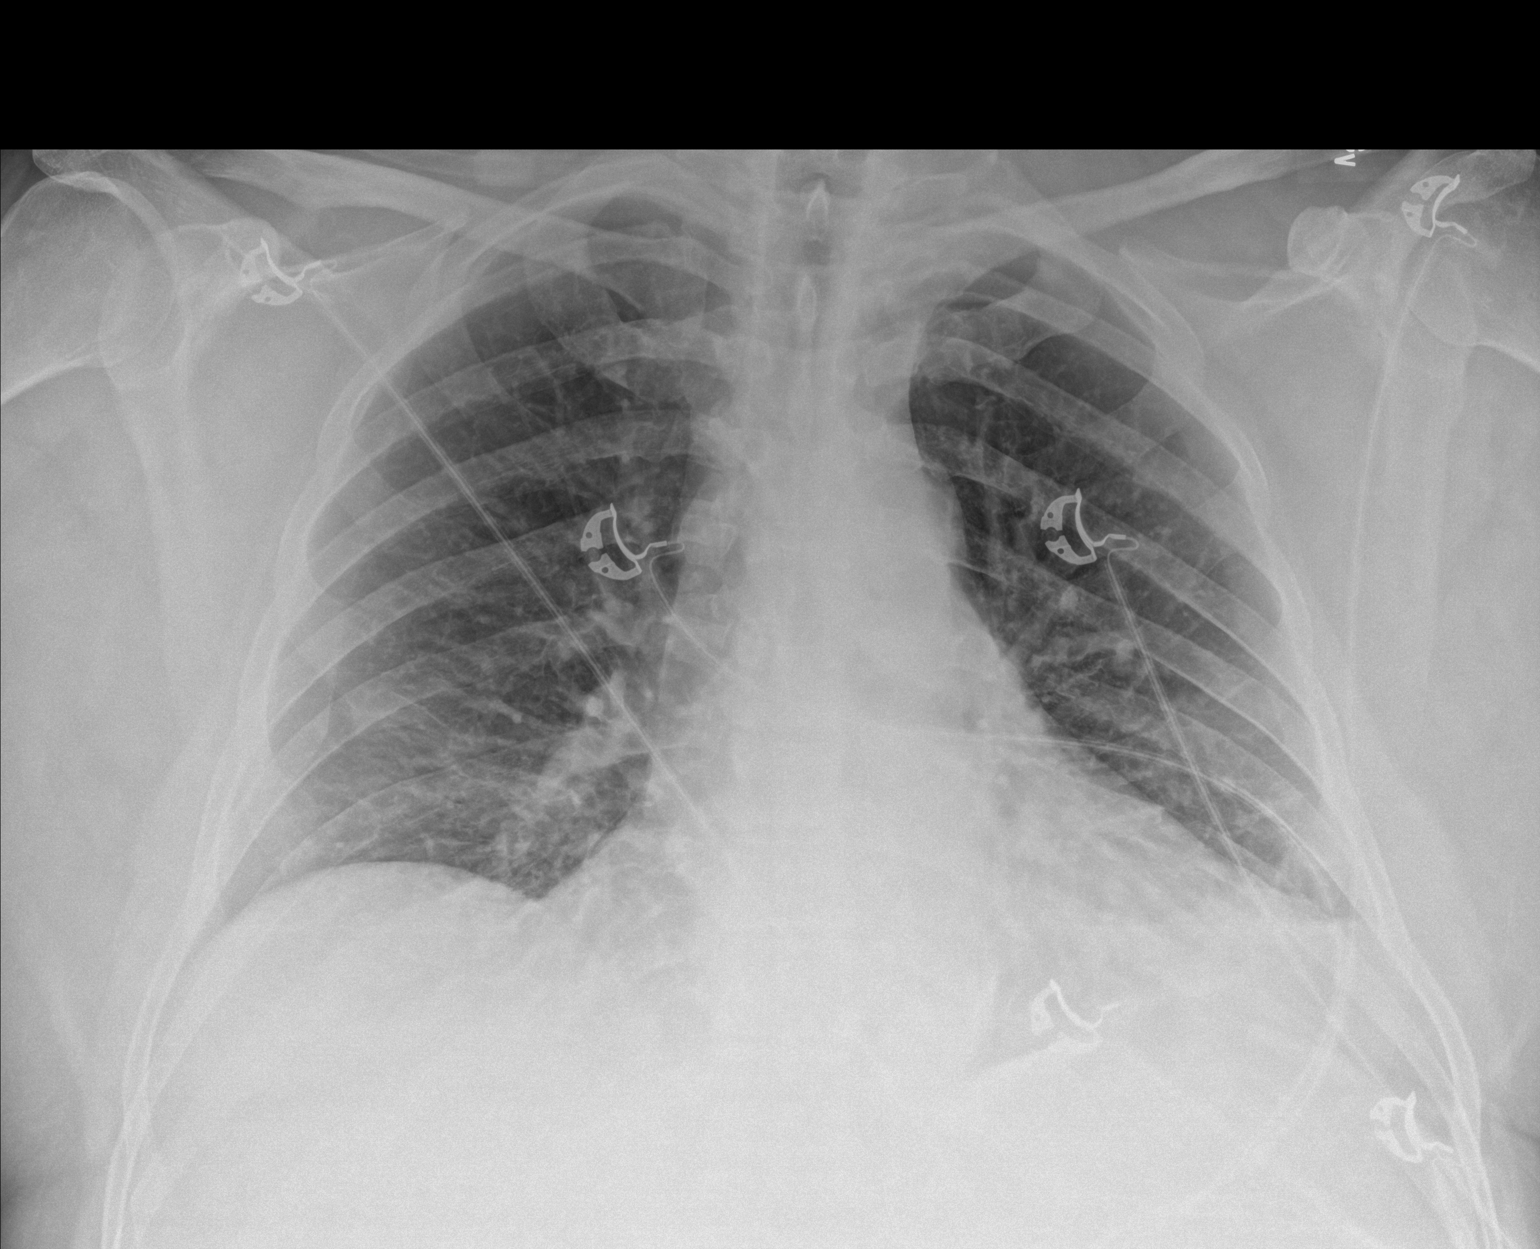

[1 of 1 positions shown; findings below may reference images not displayed]

FINDINGS: Normal heart size. Short decreased lung volumes. No pleural effusion
or edema. No airspace opacities identified.
IMPRESSION: No active disease.

## 2021-03-27 ENCOUNTER — Other Ambulatory Visit: Payer: Self-pay

## 2021-03-27 ENCOUNTER — Encounter: Payer: Self-pay | Admitting: Emergency Medicine

## 2021-03-27 ENCOUNTER — Ambulatory Visit
Admission: EM | Admit: 2021-03-27 | Discharge: 2021-03-27 | Disposition: A | Discharge status: Home / Self Care | Payer: Medicare Other | Attending: Family Medicine | Admitting: Family Medicine

## 2021-03-27 DIAGNOSIS — L255 Unspecified contact dermatitis due to plants, except food: Secondary | ICD-10-CM

## 2021-03-27 MED ORDER — PREDNISONE 10 MG PO TABS: ORAL_TABLET | ORAL | 0 refills | Status: DC

## 2021-03-27 NOTE — ED Triage Notes (Signed)
Patient c/o red itchy rash on his arms and legs for a week.

## 2021-03-27 NOTE — ED Provider Notes (Signed)
[facility]    CSN: [id number] Arrival date & time: 03/27/21  1115      History   Chief Complaint Chief Complaint  Patient presents with  . Rash   HPI  55 year old male presents with rash.  Patient reports that he was recently mowing grass approximate 1 week ago.  He states that he subsequently developed a rash.  He believes it is poison oak or poison ivy.  There is located on his arms, lower extremities.  He also states that it is in his groin and genital region.  No relieving factors.  Associated itching.  No other complaints.  Past Medical History:  Diagnosis Date  . Bipolar disorder ([place])   . Insomnia   . Sleep apnea     Patient Active Problem List   Diagnosis Date Noted  . Closed fracture of second metatarsal bone of right foot 11/27/2019  . Diabetes mellitus without complication ([place]) 87/86/7672  . Bipolar disorder ([place])   . Sleep apnea   . Abnormal LFTs   . Acute respiratory failure with hypoxia ([place]) 08/30/2018    Past Surgical History:  Procedure Laterality Date  . CHOLECYSTECTOMY         Home Medications    Prior to Admission medications   Medication Sig Start Date End Date Taking? Authorizing Provider  ARIPiprazole (ABILIFY) 20 MG tablet Take 20 mg by mouth at bedtime.    Yes [provider]  doxepin (SINEQUAN) 100 MG capsule Take 300 mg by mouth at bedtime.    Yes [provider]  metFORMIN (GLUCOPHAGE) 1000 MG tablet Take 1 tablet (1,000 mg total) by mouth 2 (two) times daily with a meal. 12/01/19 11/30/20 Yes Lorella Nimrod, MD  predniSONE (DELTASONE) 10 MG tablet 50 mg daily x 3 days, then 40 mg daily x 3 days, then 30 mg daily x 3 days, then 20 mg daily x 3 days, then 10 mg daily x 3 days. 03/27/21  Yes Mariann Palo G, DO  QUEtiapine (SEROQUEL) 400 MG tablet Take 800 mg by mouth at bedtime.    Yes [provider]  blood glucose meter kit and supplies KIT Dispense based on patient and insurance preference. Use up to  four times daily as directed. (FOR ICD-9 250.00, 250.01). 09/01/18   Henreitta Leber, MD  Ipratropium-Albuterol (COMBIVENT) 20-100 MCG/ACT AERS respimat Inhale 1 puff into the lungs every 6 (six) hours as needed for wheezing or shortness of breath. 12/01/19   Lorella Nimrod, MD    Family History Family History  Problem Relation Age of Onset  . Breast cancer Mother     Social History Social History   Tobacco Use  . Smoking status: Former Smoker    Packs/day: 1.00    Years: 2.00    Pack years: 2.00    Types: Cigarettes  . Smokeless tobacco: Former Network engineer  . Vaping Use: Never used  Substance Use Topics  . Alcohol use: Not Currently  . Drug use: Not Currently     Allergies   Lithium, Morphine, Risperidone, and Ziprasidone hcl   Review of Systems Review of Systems  Constitutional: Negative.   Skin: Positive for rash.   Physical Exam Triage Vital Signs ED Triage Vitals  Enc Vitals Group     BP 03/27/21 1144 109/86     Pulse Rate 03/27/21 1144 94     Resp 03/27/21 1144 16     Temp 03/27/21 1144 98.2 F (36.8 C)  Temp Source 03/27/21 1144 Oral     SpO2 03/27/21 1144 90 %     Weight 03/27/21 1142 270 lb (122.5 kg)     Height 03/27/21 1142 6' (1.829 m)     Head Circumference --      Peak Flow --      Pain Score 03/27/21 1142 0     Pain Loc --      Pain Edu? --      Excl. in [place]? --    Updated Vital Signs BP 109/86 (BP Location: Left Arm)   Pulse 94   Temp 98.2 F (36.8 C) (Oral)   Resp 16   Ht 6' (1.829 m)   Wt 122.5 kg   SpO2 90%   BMI 36.62 kg/m   Visual Acuity Right Eye Distance:   Left Eye Distance:   Bilateral Distance:    Right Eye Near:   Left Eye Near:    Bilateral Near:     Physical Exam Constitutional:      General: He is not in acute distress.    Appearance: Normal appearance. He is obese. He is not ill-appearing.  HENT:     Head: Normocephalic and atraumatic.  Eyes:     General:        Right eye: No discharge.         Left eye: No discharge.     Conjunctiva/sclera: Conjunctivae normal.  Pulmonary:     Effort: Pulmonary effort is normal. No respiratory distress.  Skin:    Comments: Arms, and lower legs with raised, erythematous vesicular rash.  Neurological:     Mental Status: He is alert.    UC Treatments / Results  Labs (all labs ordered are listed, but only abnormal results are displayed) Labs Reviewed - No data to display  EKG   Radiology No results found.  Procedures Procedures (including critical care time)  Medications Ordered in UC Medications - No data to display  Initial Impression / Assessment and Plan / UC Course  I have reviewed the triage vital signs and the nursing notes.  Pertinent labs & imaging results that were available during my care of the patient were reviewed by me and considered in my medical decision making (see chart for details).    55 year old male presents with dermatitis secondary to poison oak or poison ivy.  Treating with prednisone.  Final Clinical Impressions(s) / UC Diagnoses   Final diagnoses:  Dermatitis due to plants, including poison ivy, sumac, and oak   Discharge Instructions   None    ED Prescriptions    Medication Sig Dispense Auth. Provider   predniSONE (DELTASONE) 10 MG tablet 50 mg daily x 3 days, then 40 mg daily x 3 days, then 30 mg daily x 3 days, then 20 mg daily x 3 days, then 10 mg daily x 3 days. 45 tablet Coral Spikes, DO     PDMP not reviewed this encounter.   Coral Spikes, [place] 03/27/21 1218

## 2021-04-04 ENCOUNTER — Other Ambulatory Visit: Payer: Self-pay

## 2021-04-04 ENCOUNTER — Ambulatory Visit
Admission: EM | Admit: 2021-04-04 | Discharge: 2021-04-04 | Disposition: A | Discharge status: Home / Self Care | Payer: Medicare Other | Attending: Emergency Medicine | Admitting: Emergency Medicine

## 2021-04-04 DIAGNOSIS — Z20822 Contact with and (suspected) exposure to covid-19: Secondary | ICD-10-CM | POA: Diagnosis present

## 2021-04-04 DIAGNOSIS — J0110 Acute frontal sinusitis, unspecified: Secondary | ICD-10-CM | POA: Insufficient documentation

## 2021-04-04 LAB — RESP PANEL BY RT-PCR (FLU A&B, COVID) ARPGX2
Influenza A by PCR: NEGATIVE
Influenza B by PCR: NEGATIVE
SARS Coronavirus 2 by RT PCR: NEGATIVE

## 2021-04-04 MED ORDER — IBUPROFEN 600 MG PO TABS: 600.0000 mg | ORAL_TABLET | Freq: Four times a day (QID) | ORAL | 0 refills | Status: AC | PRN

## 2021-04-04 MED ORDER — FLUTICASONE PROPIONATE 50 MCG/ACT NA SUSP: 2.0000 | Freq: Every day | NASAL | 0 refills | Status: AC

## 2021-04-04 MED ORDER — AMOXICILLIN-POT CLAVULANATE 875-125 MG PO TABS: 1.0000 | ORAL_TABLET | Freq: Two times a day (BID) | ORAL | 0 refills | Status: DC

## 2021-04-04 NOTE — ED Provider Notes (Addendum)
HPI  SUBJECTIVE:  Randy Velez is a 55 y.o. male who presents with 1 week of body aches, nasal congestion, rhinorrhea, intermittent sore throat.  States that he was exposed to his nephew who has pneumonia from an unknown cause several days before he got sick.  No sinus pain or pressure, facial swelling, upper dental pain.  No loss of sense of smell or taste, fevers, headaches, cough, wheeze above his baseline, shortness of breath.  No nausea, vomiting, diarrhea, abdominal pain.  He got the second COVID vaccine in July 21.  He also got the flu vaccine.  No antibiotics in the past month.  No antipyretic in the past 6 hours.  No aggravating or alleviating factors.  He has not tried anything for this.  He has a past medical history of diabetes, COPD per chart review, bipolar, schizophrenia.  No history of chronic kidney disease, hypertension.  PMD: Chelsea Bowen.    Past Medical History:  Diagnosis Date  . Bipolar disorder ([place])   . Insomnia   . Sleep apnea     Past Surgical History:  Procedure Laterality Date  . CHOLECYSTECTOMY      Family History  Problem Relation Age of Onset  . Breast cancer Mother     Social History   Tobacco Use  . Smoking status: Former Smoker    Packs/day: 1.00    Years: 2.00    Pack years: 2.00    Types: Cigarettes  . Smokeless tobacco: Former Network engineer  . Vaping Use: Never used  Substance Use Topics  . Alcohol use: Not Currently  . Drug use: Not Currently    No current facility-administered medications for this encounter.  Current Outpatient Medications:  .  amoxicillin-clavulanate (AUGMENTIN) 875-125 MG tablet, Take 1 tablet by mouth 2 (two) times daily. X 7 days, Disp: 14 tablet, Rfl: 0 .  ARIPiprazole (ABILIFY) 20 MG tablet, Take 20 mg by mouth at bedtime. , Disp: , Rfl:  .  doxepin (SINEQUAN) 100 MG capsule, Take 300 mg by mouth at bedtime. , Disp: , Rfl:  .  FARXIGA 10 MG TABS tablet, Take 10 mg by mouth daily., Disp: , Rfl:  .   fluticasone (FLONASE) 50 MCG/ACT nasal spray, Place 2 sprays into both nostrils daily., Disp: 16 g, Rfl: 0 .  ibuprofen (ADVIL) 600 MG tablet, Take 1 tablet (600 mg total) by mouth every 6 (six) hours as needed., Disp: 30 tablet, Rfl: 0 .  metFORMIN (GLUCOPHAGE) 1000 MG tablet, Take 1 tablet (1,000 mg total) by mouth 2 (two) times daily with a meal., Disp: 60 tablet, Rfl: 11 .  QUEtiapine (SEROQUEL) 400 MG tablet, Take 800 mg by mouth at bedtime. , Disp: , Rfl:  .  simvastatin (ZOCOR) 20 MG tablet, Take 20 mg by mouth at bedtime., Disp: , Rfl:  .  blood glucose meter kit and supplies KIT, Dispense based on patient and insurance preference. Use up to four times daily as directed. (FOR ICD-9 250.00, 250.01)., Disp: 1 each, Rfl: 0 .  Ipratropium-Albuterol (COMBIVENT) 20-100 MCG/ACT AERS respimat, Inhale 1 puff into the lungs every 6 (six) hours as needed for wheezing or shortness of breath., Disp: 4 g, Rfl: 0  Allergies  Allergen Reactions  . Lithium   . Morphine Other (See Comments)    Short of breath  . Risperidone   . Ziprasidone Hcl Anxiety     ROS  As noted in HPI.   Physical Exam  BP 121/80 (BP Location: Left  Arm)   Pulse (!) 101   Temp 98.5 F (36.9 C) (Oral)   Resp 18   Ht 6' (1.829 m)   Wt 122.5 kg   SpO2 93%   BMI 36.62 kg/m   Constitutional: Well developed, well nourished, no acute distress Eyes:  EOMI, conjunctiva normal bilaterally HENT: Normocephalic, atraumatic,mucus membranes moist.  Purulent nasal congestion.  Erythematous, swollen turbinates.  Positive maxillary sinus tenderness.  No frontal sinus tenderness.  Unable to completely visualize the oropharynx Respiratory: Normal inspiratory effort, lungs clear bilaterally, good air movement Cardiovascular: Mild regular tachycardia, no murmurs rubs or gallops GI: nondistended skin: No rash, skin intact Musculoskeletal: no deformities Neurologic: Alert & oriented x 3, no focal neuro deficits Psychiatric: Speech  and behavior appropriate   ED Course   Medications - No data to display  Orders Placed This Encounter  Procedures  . Resp Panel by RT-PCR (Flu A&B, Covid) Nasopharyngeal Swab    Standing Status:   Standing    Number of Occurrences:   1    Order Specific Question:   Is this test for diagnosis or screening    Answer:   Diagnosis of ill patient    Order Specific Question:   Symptomatic for COVID-19 as defined by CDC    Answer:   Yes    Order Specific Question:   Date of Symptom Onset    Answer:   03/29/2021    Order Specific Question:   Hospitalized for COVID-19    Answer:   No    Order Specific Question:   Admitted to ICU for COVID-19    Answer:   No    Order Specific Question:   Previously tested for COVID-19    Answer:   Yes    Order Specific Question:   Resident in a congregate (group) care setting    Answer:   No    Order Specific Question:   Employed in healthcare setting    Answer:   No    Order Specific Question:   Has patient completed COVID vaccination(s) (2 doses of Pfizer/Moderna 1 dose of [employer])    Answer:   Yes    Order Specific Question:   Has patient completed COVID Booster / 3rd dose    Answer:   No    Results for orders placed or performed during the hospital encounter of 04/04/21 (from the past 24 hour(s))  Resp Panel by RT-PCR (Flu A&B, Covid) Nasopharyngeal Swab     Status: None   Collection Time: 04/04/21  2:19 PM   Specimen: Nasopharyngeal Swab; Nasopharyngeal(NP) swabs in vial transport medium  Result Value Ref Range   SARS Coronavirus 2 by RT PCR NEGATIVE NEGATIVE   Influenza A by PCR NEGATIVE NEGATIVE   Influenza B by PCR NEGATIVE NEGATIVE   No results found.  ED Clinical Impression  1. Acute non-recurrent frontal sinusitis   2. Encounter for laboratory testing for COVID-19 virus      ED Assessment/Plan  Patient with a sinusitis.  Given physical exam findings, will send home with Augmentin, Flonase, saline nasal irrigation,  Mucinex D, Tylenol/ibuprofen combination.  Sending off [place].  He may be a candidate for further therapy based on diabetes and BMI if COVID is positive.  Follow-up with PMD as needed, to the ER if he gets worse  Oxygen saturation is 93% noted.  This appears to be his baseline. his lungs are clear on exam today.  Deferring x-ray, bronchodilators today  5/13 0838-addendum -- flu,  COVID-negative.  Discussed labs, MDM, treatment plan, and plan for follow-up with patient. Discussed sn/sx that should prompt return to the ED. patient agrees with plan.   Meds ordered this encounter  Medications  . fluticasone (FLONASE) 50 MCG/ACT nasal spray    Sig: Place 2 sprays into both nostrils daily.    Dispense:  16 g    Refill:  0  . ibuprofen (ADVIL) 600 MG tablet    Sig: Take 1 tablet (600 mg total) by mouth every 6 (six) hours as needed.    Dispense:  30 tablet    Refill:  0  . amoxicillin-clavulanate (AUGMENTIN) 875-125 MG tablet    Sig: Take 1 tablet by mouth 2 (two) times daily. X 7 days    Dispense:  14 tablet    Refill:  0      *This clinic note was created using Lobbyist. Therefore, there may be occasional mistakes despite careful proofreading.  ?    Melynda Ripple, MD 04/04/21 Rem[place], Ronnika Collett, MD 04/05/21 [phone]

## 2021-04-04 NOTE — Discharge Instructions (Signed)
Your COVID test will be back in 6 to 24 hours.  Finish the Augmentin, even if you feel better.  Flonase, saline nasal irrigation with a [employer] sinus rinse and distilled water as often as you want, Mucinex D.  600 mg of ibuprofen combined with 1000 mg of Tylenol 3-4 times a day as needed for headaches, body aches.  Contact you if your COVID is positive.

## 2021-04-04 NOTE — ED Triage Notes (Signed)
Pt c/o body and joint aches for the past week, along with cough and nasal congestion. Pt denies f/n/v/d or other symptoms. Pt states last week he was around a family member with pna.

## 2022-04-03 DIAGNOSIS — E1165 Type 2 diabetes mellitus with hyperglycemia: Secondary | ICD-10-CM | POA: Diagnosis not present

## 2022-04-03 DIAGNOSIS — E785 Hyperlipidemia, unspecified: Secondary | ICD-10-CM | POA: Diagnosis not present

## 2022-04-03 DIAGNOSIS — F209 Schizophrenia, unspecified: Secondary | ICD-10-CM | POA: Diagnosis not present

## 2022-04-03 DIAGNOSIS — E6609 Other obesity due to excess calories: Secondary | ICD-10-CM | POA: Diagnosis not present

## 2022-04-03 DIAGNOSIS — E782 Mixed hyperlipidemia: Secondary | ICD-10-CM | POA: Diagnosis not present

## 2022-04-03 DIAGNOSIS — J449 Chronic obstructive pulmonary disease, unspecified: Secondary | ICD-10-CM | POA: Diagnosis not present

## 2022-04-03 DIAGNOSIS — G5603 Carpal tunnel syndrome, bilateral upper limbs: Secondary | ICD-10-CM | POA: Diagnosis not present

## 2022-04-03 DIAGNOSIS — R339 Retention of urine, unspecified: Secondary | ICD-10-CM | POA: Diagnosis not present

## 2022-05-21 DIAGNOSIS — H2513 Age-related nuclear cataract, bilateral: Secondary | ICD-10-CM | POA: Diagnosis not present

## 2022-05-21 DIAGNOSIS — E089 Diabetes mellitus due to underlying condition without complications: Secondary | ICD-10-CM | POA: Diagnosis not present

## 2022-07-18 DIAGNOSIS — F200 Paranoid schizophrenia: Secondary | ICD-10-CM | POA: Diagnosis not present

## 2022-07-18 DIAGNOSIS — E1165 Type 2 diabetes mellitus with hyperglycemia: Secondary | ICD-10-CM | POA: Diagnosis not present

## 2022-07-18 DIAGNOSIS — R5383 Other fatigue: Secondary | ICD-10-CM | POA: Diagnosis not present

## 2022-07-18 DIAGNOSIS — E785 Hyperlipidemia, unspecified: Secondary | ICD-10-CM | POA: Diagnosis not present

## 2022-07-18 DIAGNOSIS — E782 Mixed hyperlipidemia: Secondary | ICD-10-CM | POA: Diagnosis not present

## 2022-07-18 DIAGNOSIS — E6609 Other obesity due to excess calories: Secondary | ICD-10-CM | POA: Diagnosis not present

## 2022-07-18 DIAGNOSIS — F411 Generalized anxiety disorder: Secondary | ICD-10-CM | POA: Diagnosis not present

## 2022-07-18 DIAGNOSIS — F209 Schizophrenia, unspecified: Secondary | ICD-10-CM | POA: Diagnosis not present

## 2022-10-31 DIAGNOSIS — E782 Mixed hyperlipidemia: Secondary | ICD-10-CM | POA: Diagnosis not present

## 2022-10-31 DIAGNOSIS — E785 Hyperlipidemia, unspecified: Secondary | ICD-10-CM | POA: Diagnosis not present

## 2022-10-31 DIAGNOSIS — R5383 Other fatigue: Secondary | ICD-10-CM | POA: Diagnosis not present

## 2022-10-31 DIAGNOSIS — Z0001 Encounter for general adult medical examination with abnormal findings: Secondary | ICD-10-CM | POA: Diagnosis not present

## 2022-10-31 DIAGNOSIS — E1165 Type 2 diabetes mellitus with hyperglycemia: Secondary | ICD-10-CM | POA: Diagnosis not present

## 2022-10-31 DIAGNOSIS — R109 Unspecified abdominal pain: Secondary | ICD-10-CM | POA: Diagnosis not present

## 2022-10-31 DIAGNOSIS — J449 Chronic obstructive pulmonary disease, unspecified: Secondary | ICD-10-CM | POA: Diagnosis not present

## 2022-10-31 DIAGNOSIS — R1010 Upper abdominal pain, unspecified: Secondary | ICD-10-CM | POA: Diagnosis not present

## 2022-10-31 DIAGNOSIS — F209 Schizophrenia, unspecified: Secondary | ICD-10-CM | POA: Diagnosis not present

## 2022-11-26 DIAGNOSIS — R1010 Upper abdominal pain, unspecified: Secondary | ICD-10-CM | POA: Diagnosis not present

## 2023-01-16 DIAGNOSIS — F200 Paranoid schizophrenia: Secondary | ICD-10-CM | POA: Diagnosis not present

## 2023-01-16 DIAGNOSIS — F411 Generalized anxiety disorder: Secondary | ICD-10-CM | POA: Diagnosis not present

## 2023-02-06 ENCOUNTER — Ambulatory Visit: Payer: Medicare PPO | Admitting: Nurse Practitioner

## 2023-02-10 ENCOUNTER — Ambulatory Visit: Payer: Medicare PPO | Admitting: Nurse Practitioner

## 2023-02-19 ENCOUNTER — Ambulatory Visit (INDEPENDENT_AMBULATORY_CARE_PROVIDER_SITE_OTHER): Payer: Medicare PPO | Admitting: Nurse Practitioner

## 2023-02-19 VITALS — BP 108/76 | HR 88 | Ht 72.0 in | Wt 277.2 lb

## 2023-02-19 DIAGNOSIS — G4730 Sleep apnea, unspecified: Secondary | ICD-10-CM

## 2023-02-19 DIAGNOSIS — E782 Mixed hyperlipidemia: Secondary | ICD-10-CM | POA: Diagnosis not present

## 2023-02-19 DIAGNOSIS — E119 Type 2 diabetes mellitus without complications: Secondary | ICD-10-CM

## 2023-02-19 DIAGNOSIS — F319 Bipolar disorder, unspecified: Secondary | ICD-10-CM

## 2023-02-19 NOTE — Progress Notes (Signed)
Established Patient Office Visit  Subjective:  Patient ID: Randy Velez, male    DOB: [birth year]  Age: 57 y.o. MRN: [id number]  Chief Complaint  Patient presents with   Follow-up    3 month follow up    Follow up and fasting for labs today.  Neg ROS.    No other concerns at this time.   Past Medical History:  Diagnosis Date   Bipolar disorder ([place])    Insomnia    Sleep apnea     Past Surgical History:  Procedure Laterality Date   CHOLECYSTECTOMY      Social History   Socioeconomic History   Marital status: Single    Spouse name: Not on file   Number of children: Not on file   Years of education: Not on file   Highest education level: Not on file  Occupational History   Not on file  Tobacco Use   Smoking status: Former    Packs/day: 1.00    Years: 2.00    Additional pack years: 0.00    Total pack years: 2.00    Types: Cigarettes   Smokeless tobacco: Former  Scientific laboratory technician Use: Never used  Substance and Sexual Activity   Alcohol use: Not Currently   Drug use: Not Currently   Sexual activity: Not on file  Other Topics Concern   Not on file  Social History Narrative   Not on file   Social Determinants of Health   Financial Resource Strain: Not on file  Food Insecurity: Not on file  Transportation Needs: Not on file  Physical Activity: Not on file  Stress: Not on file  Social Connections: Not on file  Intimate Partner Violence: Not on file    Family History  Problem Relation Age of Onset   Breast cancer Mother     Allergies  Allergen Reactions   Lithium    Morphine Other (See Comments)    Short of breath   Risperidone    Ziprasidone Hcl Anxiety    Review of Systems  Constitutional: Negative.   HENT: Negative.    Eyes: Negative.   Respiratory: Negative.    Cardiovascular: Negative.   Gastrointestinal: Negative.   Genitourinary: Negative.   Musculoskeletal: Negative.   Skin: Negative.   Neurological: Negative.    Endo/Heme/Allergies: Negative.   Psychiatric/Behavioral:         Schizophrenia       Objective:   There were no vitals taken for this visit.  There were no vitals filed for this visit.  Physical Exam Vitals reviewed.  Constitutional:      Appearance: Normal appearance.  HENT:     Head: Normocephalic.     Nose: Nose normal.     Mouth/Throat:     Mouth: Mucous membranes are moist.  Eyes:     Pupils: Pupils are equal, round, and reactive to light.  Cardiovascular:     Rate and Rhythm: Normal rate and regular rhythm.  Pulmonary:     Effort: Pulmonary effort is normal.     Breath sounds: Normal breath sounds.  Abdominal:     General: Bowel sounds are normal.     Palpations: Abdomen is soft.  Musculoskeletal:        General: Normal range of motion.     Cervical back: Normal range of motion and neck supple.  Skin:    General: Skin is warm and dry.  Neurological:     Mental Status: He is alert and  oriented to person, place, and time.  Psychiatric:        Mood and Affect: Mood normal.        Behavior: Behavior normal.      No results found for any visits on 02/19/23.  No results found for this or any previous visit (from the past 2160 hour(s)).    Assessment & Plan:   Problem List Items Addressed This Visit       Respiratory   Sleep apnea     Endocrine   Diabetes mellitus without complication ([place]) - Primary     Other   Bipolar disorder ([place])   Mixed hyperlipidemia    No follow-ups on file.   Total time spent: 40 minutes  Evern Bio, NP  02/19/2023

## 2023-02-19 NOTE — Addendum Note (Signed)
Addended by: Evern Bio on: 02/19/2023 11:12 AM   Modules accepted: Orders

## 2023-02-19 NOTE — Patient Instructions (Addendum)
1) Fasting labs today 2) Follow up appt in 3 months, 2 weeks, fasting labs at that appt

## 2023-02-20 LAB — CMP14+EGFR
ALT: 19 IU/L (ref 0–44)
AST: 25 IU/L (ref 0–40)
Albumin/Globulin Ratio: 1.6 (ref 1.2–2.2)
Albumin: 4.1 g/dL (ref 3.8–4.9)
Alkaline Phosphatase: 66 IU/L (ref 44–121)
BUN/Creatinine Ratio: 19 (ref 9–20)
BUN: 13 mg/dL (ref 6–24)
Bilirubin Total: 0.6 mg/dL (ref 0.0–1.2)
CO2: 21 mmol/L (ref 20–29)
Calcium: 8.9 mg/dL (ref 8.7–10.2)
Chloride: 100 mmol/L (ref 96–106)
Creatinine, Ser: 0.67 mg/dL — ABNORMAL LOW (ref 0.76–1.27)
Globulin, Total: 2.5 g/dL (ref 1.5–4.5)
Glucose: 89 mg/dL (ref 70–99)
Potassium: 4.2 mmol/L (ref 3.5–5.2)
Sodium: 139 mmol/L (ref 134–144)
Total Protein: 6.6 g/dL (ref 6.0–8.5)
eGFR: 108 mL/min/{1.73_m2} (ref >59)

## 2023-02-20 LAB — LIPID PANEL
Chol/HDL Ratio: 4.3 ratio (ref 0.0–5.0)
Cholesterol, Total: 120 mg/dL (ref 100–199)
HDL: 28 mg/dL — ABNORMAL LOW (ref >39)
LDL Chol Calc (NIH): 67 mg/dL (ref 0–99)
Triglycerides: 139 mg/dL (ref 0–149)
VLDL Cholesterol Cal: 25 mg/dL (ref 5–40)

## 2023-02-20 LAB — HEMOGLOBIN A1C
Est. average glucose Bld gHb Est-mCnc: 146 mg/dL
Hgb A1c MFr Bld: 6.7 % — ABNORMAL HIGH (ref 4.8–5.6)

## 2023-02-20 LAB — TSH: TSH: 0.881 u[IU]/mL (ref 0.450–4.500)

## 2023-03-26 ENCOUNTER — Other Ambulatory Visit: Payer: Self-pay

## 2023-03-27 ENCOUNTER — Other Ambulatory Visit: Payer: Self-pay | Admitting: Nurse Practitioner

## 2023-03-27 DIAGNOSIS — E782 Mixed hyperlipidemia: Secondary | ICD-10-CM

## 2023-03-27 MED ORDER — SIMVASTATIN 20 MG PO TABS: 20.0000 mg | ORAL_TABLET | Freq: Every day | ORAL | 11 refills | Status: DC

## 2023-03-30 ENCOUNTER — Other Ambulatory Visit: Payer: Self-pay

## 2023-03-30 DIAGNOSIS — E782 Mixed hyperlipidemia: Secondary | ICD-10-CM

## 2023-03-30 MED ORDER — SIMVASTATIN 20 MG PO TABS: 20.0000 mg | ORAL_TABLET | Freq: Every day | ORAL | 11 refills | Status: DC

## 2023-04-14 ENCOUNTER — Other Ambulatory Visit: Payer: Medicare PPO

## 2023-04-21 DIAGNOSIS — R109 Unspecified abdominal pain: Secondary | ICD-10-CM | POA: Diagnosis not present

## 2023-04-21 DIAGNOSIS — E119 Type 2 diabetes mellitus without complications: Secondary | ICD-10-CM | POA: Diagnosis not present

## 2023-04-28 DIAGNOSIS — R0902 Hypoxemia: Secondary | ICD-10-CM | POA: Diagnosis not present

## 2023-04-28 DIAGNOSIS — R1011 Right upper quadrant pain: Secondary | ICD-10-CM | POA: Diagnosis not present

## 2023-04-28 DIAGNOSIS — R140 Abdominal distension (gaseous): Secondary | ICD-10-CM | POA: Diagnosis not present

## 2023-04-28 DIAGNOSIS — Z885 Allergy status to narcotic agent status: Secondary | ICD-10-CM | POA: Diagnosis not present

## 2023-04-28 DIAGNOSIS — G4733 Obstructive sleep apnea (adult) (pediatric): Secondary | ICD-10-CM | POA: Diagnosis not present

## 2023-04-28 DIAGNOSIS — J9811 Atelectasis: Secondary | ICD-10-CM | POA: Diagnosis not present

## 2023-04-28 DIAGNOSIS — R109 Unspecified abdominal pain: Secondary | ICD-10-CM | POA: Diagnosis not present

## 2023-04-28 DIAGNOSIS — E785 Hyperlipidemia, unspecified: Secondary | ICD-10-CM | POA: Diagnosis not present

## 2023-04-28 DIAGNOSIS — E119 Type 2 diabetes mellitus without complications: Secondary | ICD-10-CM | POA: Diagnosis not present

## 2023-04-28 DIAGNOSIS — D751 Secondary polycythemia: Secondary | ICD-10-CM | POA: Diagnosis not present

## 2023-04-28 DIAGNOSIS — F609 Personality disorder, unspecified: Secondary | ICD-10-CM | POA: Diagnosis not present

## 2023-04-28 DIAGNOSIS — F32A Depression, unspecified: Secondary | ICD-10-CM | POA: Diagnosis not present

## 2023-04-29 DIAGNOSIS — R109 Unspecified abdominal pain: Secondary | ICD-10-CM | POA: Diagnosis not present

## 2023-05-07 ENCOUNTER — Ambulatory Visit
Admission: EM | Admit: 2023-05-07 | Discharge: 2023-05-07 | Disposition: A | Discharge status: Home / Self Care | Payer: Medicare PPO

## 2023-05-07 DIAGNOSIS — R1031 Right lower quadrant pain: Secondary | ICD-10-CM

## 2023-05-07 DIAGNOSIS — R1011 Right upper quadrant pain: Secondary | ICD-10-CM

## 2023-05-07 NOTE — ED Triage Notes (Signed)
Patient here today with c/o right side abd pain that shoots down to his groin area that has been going on for over a month now. No fever, No N&V, No diarrhea. No sick contacts. No recent travel.

## 2023-05-07 NOTE — ED Provider Notes (Addendum)
[facility]    CSN: [id number] Arrival date & time: 05/07/23  1510      History   Chief Complaint Chief Complaint  Patient presents with   Abdominal Pain    HPI Randy Velez is a 57 y.o. male presents for evaluation of abdominal pain.  Patient has a past medical history of diabetes, hyperlipidemia, OSA, bipolar/schizoaffective disorder.  Patient reports 1 months of a intermittent right-sided abdominal pain that begins in his right upper quadrant and radiates down to his right groin.  Does not occur every day he states maybe every other day.  When it does occur it occurs multiple times throughout the day primarily triggered by certain positions.  He denies any nausea/vomiting/diarrhea, fevers or chills.  No dysuria or back pain.  Having normal bowel movements.  States he is eating and drinking normally.  No history of abdominal surgeries.  He was seen in the emergency room on June 4 for same complaint.  He had a unremarkable CT of his abdomen.  Urine was negative.  CMP, ETOH, ammonia, lipase blood work were unremarkable.  His CBC did show polycythemia with a hemoglobin of 20.5 and a hematocrit of 60.4.  Platelet of 144.  No known history of polycythemia.  Per ER note it was recommended that he stay for hematology consult but he declined.  He does live in [place] and is only here for on business twice a month.  He states he has a PCP follow-up in mid July.  He states his symptoms have not worsened but are stable.  In addition patient is hypoxic in clinic at an O2 of 86% on room air.  This was the same when he was in the emergency room on the fourth.  Per patient he supposed to be wearing oxygen for unclear reasons but refuses to.  He states this is his baseline.  This has been documented in his PCP note from March but does not give a clear indication as to why he is hypoxic.  Denies any active shortness of breath.  No other concerns at this time   Abdominal Pain   Past Medical  History:  Diagnosis Date   Bipolar disorder (HCC)    Insomnia    Sleep apnea     Patient Active Problem List   Diagnosis Date Noted   Mixed hyperlipidemia 02/19/2023   Closed fracture of second metatarsal bone of right foot 11/27/2019   Diabetes mellitus without complication (HCC) 11/27/2019   Bipolar disorder (HCC)    Sleep apnea    Abnormal LFTs    Acute respiratory failure with hypoxia (HCC) 08/30/2018    Past Surgical History:  Procedure Laterality Date   CHOLECYSTECTOMY         Home Medications    Prior to Admission medications   Medication Sig Start Date End Date Taking? Authorizing Provider  ARIPiprazole (ABILIFY) 20 MG tablet Take 20 mg by mouth at bedtime.    Yes [provider]  doxepin (SINEQUAN) 100 MG capsule Take 300 mg by mouth at bedtime.    Yes [provider]  FARXIGA 10 MG TABS tablet Take 10 mg by mouth daily. 03/07/21  Yes [provider]  metFORMIN (GLUCOPHAGE) 1000 MG tablet Take 1 tablet (1,000 mg total) by mouth 2 (two) times daily with a meal. 12/01/19 05/07/23 Yes Arnetha Courser, MD  QUEtiapine (SEROQUEL) 400 MG tablet Take 800 mg by mouth at bedtime.    Yes [provider]  simvastatin (ZOCOR) 20  MG tablet Take 1 tablet (20 mg total) by mouth at bedtime. 03/30/23  Yes Orson Eva, NP  amoxicillin-clavulanate (AUGMENTIN) 875-125 MG tablet Take 1 tablet by mouth 2 (two) times daily. X 7 days Patient not taking: Reported on 02/19/2023 04/04/21   Domenick Gong, MD  blood glucose meter kit and supplies KIT Dispense based on patient and insurance preference. Use up to four times daily as directed. (FOR ICD-9 250.00, 250.01). 09/01/18   Houston Siren, MD  fluticasone (FLONASE) 50 MCG/ACT nasal spray Place 2 sprays into both nostrils daily. Patient not taking: Reported on 02/19/2023 04/04/21   Domenick Gong, MD  ibuprofen (ADVIL) 600 MG tablet Take 1 tablet (600 mg total) by mouth every 6 (six) hours as needed.  04/04/21   Domenick Gong, MD  Ipratropium-Albuterol (COMBIVENT) 20-100 MCG/ACT AERS respimat Inhale 1 puff into the lungs every 6 (six) hours as needed for wheezing or shortness of breath. Patient not taking: Reported on 02/19/2023 12/01/19   Arnetha Courser, MD    Family History Family History  Problem Relation Age of Onset   Breast cancer Mother     Social History Social History   Tobacco Use   Smoking status: Former    Packs/day: 1.00    Years: 2.00    Additional pack years: 0.00    Total pack years: 2.00    Types: Cigarettes   Smokeless tobacco: Former  Building services engineer Use: Never used  Substance Use Topics   Alcohol use: Not Currently   Drug use: Not Currently     Allergies   Lithium, Morphine, Risperidone, and Ziprasidone hcl   Review of Systems Review of Systems  Gastrointestinal:  Positive for abdominal pain.     Physical Exam Triage Vital Signs ED Triage Vitals  Enc Vitals Group     BP 05/07/23 1518 128/83     Pulse Rate 05/07/23 1518 85     Resp 05/07/23 1518 16     Temp 05/07/23 1518 98.4 F (36.9 C)     Temp Source 05/07/23 1518 Oral     SpO2 05/07/23 1518 (!) 86 %     Weight 05/07/23 1518 286 lb (129.7 kg)     Height 05/07/23 1518 6' (1.829 m)     Head Circumference --      Peak Flow --      Pain Score 05/07/23 1517 8     Pain Loc --      Pain Edu? --      Excl. in GC? --    No data found.  Updated Vital Signs BP 128/83 (BP Location: Right Arm)   Pulse 85   Temp 98.4 F (36.9 C) (Oral)   Resp 16   Ht 6' (1.829 m)   Wt 286 lb (129.7 kg)   SpO2 (!) 86%   BMI 38.79 kg/m   Visual Acuity Right Eye Distance:   Left Eye Distance:   Bilateral Distance:    Right Eye Near:   Left Eye Near:    Bilateral Near:     Physical Exam Vitals and nursing note reviewed.  Constitutional:      General: He is not in acute distress.    Appearance: He is obese. He is not ill-appearing, toxic-appearing or diaphoretic.  HENT:     Head:  Normocephalic and atraumatic.  Eyes:     Pupils: Pupils are equal, round, and reactive to light.  Cardiovascular:     Rate and Rhythm: Normal rate.  Pulmonary:  Effort: Pulmonary effort is normal.  Abdominal:     General: Bowel sounds are normal.     Tenderness: There is abdominal tenderness in the right upper quadrant and right lower quadrant. There is no right CVA tenderness, left CVA tenderness or guarding. Negative signs include Rovsing's sign and McBurney's sign.     Comments: Body habitus does somewhat limit abdominal exam  Skin:    General: Skin is warm and dry.  Neurological:     General: No focal deficit present.     Mental Status: He is alert and oriented to person, place, and time.  Psychiatric:        Mood and Affect: Mood normal.        Behavior: Behavior normal.      UC Treatments / Results  Labs (all labs ordered are listed, but only abnormal results are displayed) Labs Reviewed - No data to display  CBC w/ Differential Order: [id number] Component Ref Range & Units 9 d ago  WBC 3.6 - 11.2 10*9/L 8.8  RBC 4.26 - 5.60 10*12/L 6.44 High   HGB 12.9 - 16.5 g/dL 52.8 High Panic   HCT 41.3 - 48.0 % 60.4 High Panic   MCV 77.6 - 95.7 fL 93.9  MCH 25.9 - 32.4 pg 31.9  MCHC 32.0 - 36.0 g/dL 24.4  RDW 01.0 - 27.2 % 14.5  MPV 6.8 - 10.7 fL 7.9  Platelet 150 - 450 10*9/L 144 Low   nRBC <=4 /100 WBCs 3  Neutrophils % % 65.9  Lymphocytes % % 24.0  Monocytes % % 7.6  Eosinophils % % 1.6  Basophils % % 0.9  Absolute Neutrophils 1.8 - 7.8 10*9/L 5.8  Absolute Lymphocytes 1.1 - 3.6 10*9/L 2.1  Absolute Monocytes 0.3 - 0.8 10*9/L 0.7  Absolute Eosinophils 0.0 - 0.5 10*9/L 0.1  Absolute Basophils 0.0 - 0.1 10*9/L 0.1  Resulting Agency [facility]  Narrative Performed by [facility] WBC, PLT and/or Differential requires smear review. Expected TAT is 2 hours.  Specimen Collected: 04/28/23 12:45   Performed by:  [facility] Last Resulted: 04/28/23 13:25  Received From: [facility]  Result Received: 05/07/23 15:10    EKG   Radiology No results found.  Procedures Procedures (including critical care time)  Medications Ordered in UC Medications - No data to display  Initial Impression / Assessment and Plan / UC Course  I have reviewed the triage vital signs and the nursing notes.  Pertinent labs & imaging results that were available during my care of the patient were reviewed by me and considered in my medical decision making (see chart for details).     I reviewed with patient current symptoms and ER workup recently.  He is well-appearing and in no acute distress.  Did discuss limitations and abilities of urgent care.  Patient with stable abdominal pain x 1 month with negative workup in ER.  Concern for polycythemia and hypoxia as well.  Discussed with patient returning to the ER for workup of this but he declined.  States he is driving back to [place] today.  He will follow-up with his PCP at his scheduled appointment in mid July.  I did strongly encourage him to follow-up with GI and hematology for workup of these other symptoms.  Also discussed pulmonology workup given his hypoxia.  He verbalized understanding of this and states he will discuss with his PCP.  I did advise he go to the ER ASAP for any new or  worsening symptoms that occur prior to him seeing his PCP and he verbalized understanding. Final Clinical Impressions(s) / UC Diagnoses   Final diagnoses:  Right lower quadrant abdominal pain  Right upper quadrant abdominal pain     Discharge Instructions      Please follow-up with your PCP or scheduled appointment next month.  Please go to the emergency room for any worsening symptoms that occur prior to seeing your PCP Continue bland diet    ED Prescriptions   None    PDMP not reviewed this encounter.   Radford Pax, NP 05/07/23 1604    Radford Pax, NP 05/07/23 [phone]

## 2023-05-07 NOTE — Discharge Instructions (Signed)
Please follow-up with your PCP or scheduled appointment next month.  Please go to the emergency room for any worsening symptoms that occur prior to seeing your PCP Continue bland diet

## 2023-05-23 DIAGNOSIS — R0902 Hypoxemia: Secondary | ICD-10-CM | POA: Diagnosis not present

## 2023-05-23 DIAGNOSIS — J9621 Acute and chronic respiratory failure with hypoxia: Secondary | ICD-10-CM | POA: Diagnosis not present

## 2023-05-23 DIAGNOSIS — R918 Other nonspecific abnormal finding of lung field: Secondary | ICD-10-CM | POA: Diagnosis not present

## 2023-05-23 DIAGNOSIS — D696 Thrombocytopenia, unspecified: Secondary | ICD-10-CM | POA: Diagnosis not present

## 2023-05-23 DIAGNOSIS — Z1152 Encounter for screening for COVID-19: Secondary | ICD-10-CM | POA: Diagnosis not present

## 2023-05-23 DIAGNOSIS — D751 Secondary polycythemia: Secondary | ICD-10-CM | POA: Diagnosis not present

## 2023-05-23 DIAGNOSIS — E11649 Type 2 diabetes mellitus with hypoglycemia without coma: Secondary | ICD-10-CM | POA: Diagnosis not present

## 2023-05-23 DIAGNOSIS — R1084 Generalized abdominal pain: Secondary | ICD-10-CM | POA: Diagnosis not present

## 2023-05-23 DIAGNOSIS — G4733 Obstructive sleep apnea (adult) (pediatric): Secondary | ICD-10-CM | POA: Diagnosis not present

## 2023-05-23 DIAGNOSIS — G9341 Metabolic encephalopathy: Secondary | ICD-10-CM | POA: Diagnosis not present

## 2023-05-23 DIAGNOSIS — E1165 Type 2 diabetes mellitus with hyperglycemia: Secondary | ICD-10-CM | POA: Diagnosis not present

## 2023-05-23 DIAGNOSIS — J9601 Acute respiratory failure with hypoxia: Secondary | ICD-10-CM | POA: Diagnosis not present

## 2023-05-23 DIAGNOSIS — J9622 Acute and chronic respiratory failure with hypercapnia: Secondary | ICD-10-CM | POA: Diagnosis not present

## 2023-05-23 DIAGNOSIS — E872 Acidosis, unspecified: Secondary | ICD-10-CM | POA: Diagnosis not present

## 2023-05-24 DIAGNOSIS — E872 Acidosis, unspecified: Secondary | ICD-10-CM | POA: Diagnosis not present

## 2023-05-24 DIAGNOSIS — G4733 Obstructive sleep apnea (adult) (pediatric): Secondary | ICD-10-CM | POA: Diagnosis not present

## 2023-05-24 DIAGNOSIS — Z1152 Encounter for screening for COVID-19: Secondary | ICD-10-CM | POA: Diagnosis not present

## 2023-05-24 DIAGNOSIS — R404 Transient alteration of awareness: Secondary | ICD-10-CM | POA: Diagnosis not present

## 2023-05-24 DIAGNOSIS — J9601 Acute respiratory failure with hypoxia: Secondary | ICD-10-CM | POA: Diagnosis not present

## 2023-05-24 DIAGNOSIS — E11649 Type 2 diabetes mellitus with hypoglycemia without coma: Secondary | ICD-10-CM | POA: Diagnosis not present

## 2023-05-24 DIAGNOSIS — D696 Thrombocytopenia, unspecified: Secondary | ICD-10-CM | POA: Diagnosis not present

## 2023-05-24 DIAGNOSIS — E119 Type 2 diabetes mellitus without complications: Secondary | ICD-10-CM | POA: Diagnosis not present

## 2023-05-24 DIAGNOSIS — E1165 Type 2 diabetes mellitus with hyperglycemia: Secondary | ICD-10-CM | POA: Diagnosis not present

## 2023-05-24 DIAGNOSIS — R0600 Dyspnea, unspecified: Secondary | ICD-10-CM | POA: Diagnosis not present

## 2023-05-24 DIAGNOSIS — J9622 Acute and chronic respiratory failure with hypercapnia: Secondary | ICD-10-CM | POA: Diagnosis not present

## 2023-05-24 DIAGNOSIS — R339 Retention of urine, unspecified: Secondary | ICD-10-CM | POA: Diagnosis not present

## 2023-05-24 DIAGNOSIS — J9621 Acute and chronic respiratory failure with hypoxia: Secondary | ICD-10-CM | POA: Diagnosis not present

## 2023-05-24 DIAGNOSIS — D751 Secondary polycythemia: Secondary | ICD-10-CM | POA: Diagnosis not present

## 2023-05-24 DIAGNOSIS — R109 Unspecified abdominal pain: Secondary | ICD-10-CM | POA: Diagnosis not present

## 2023-05-24 DIAGNOSIS — G9341 Metabolic encephalopathy: Secondary | ICD-10-CM | POA: Diagnosis not present

## 2023-05-27 DIAGNOSIS — H2513 Age-related nuclear cataract, bilateral: Secondary | ICD-10-CM | POA: Diagnosis not present

## 2023-05-27 DIAGNOSIS — H524 Presbyopia: Secondary | ICD-10-CM | POA: Diagnosis not present

## 2023-05-27 DIAGNOSIS — E089 Diabetes mellitus due to underlying condition without complications: Secondary | ICD-10-CM | POA: Diagnosis not present

## 2023-06-01 ENCOUNTER — Ambulatory Visit (INDEPENDENT_AMBULATORY_CARE_PROVIDER_SITE_OTHER): Payer: Medicare PPO | Admitting: Cardiology

## 2023-06-01 ENCOUNTER — Encounter: Payer: Self-pay | Admitting: Cardiology

## 2023-06-01 VITALS — BP 112/82 | HR 91 | Ht 72.0 in | Wt 285.0 lb

## 2023-06-01 DIAGNOSIS — Z125 Encounter for screening for malignant neoplasm of prostate: Secondary | ICD-10-CM | POA: Diagnosis not present

## 2023-06-01 DIAGNOSIS — E119 Type 2 diabetes mellitus without complications: Secondary | ICD-10-CM | POA: Diagnosis not present

## 2023-06-01 DIAGNOSIS — R7989 Other specified abnormal findings of blood chemistry: Secondary | ICD-10-CM

## 2023-06-01 DIAGNOSIS — E782 Mixed hyperlipidemia: Secondary | ICD-10-CM

## 2023-06-01 DIAGNOSIS — G4730 Sleep apnea, unspecified: Secondary | ICD-10-CM

## 2023-06-01 LAB — POCT CBG (FASTING - GLUCOSE)-MANUAL ENTRY: Glucose Fasting, POC: 123 mg/dL — AB (ref 70–99)

## 2023-06-01 NOTE — Progress Notes (Unsigned)
Established Patient Office Visit  Subjective:  Patient ID: Randy Velez, male    DOB: May 23, 1966  Age: 57 y.o. MRN: [id number]  Chief Complaint  Patient presents with   Follow-up    3 month follow up    Patient in office for 3 month follow up. Blood work not done. Patient is fasting today. Patient is feeling well, has no complaints today. Reports he is compliant with his medications. Follows with behavioral health for schizophrenia.    No other concerns at this time.   Past Medical History:  Diagnosis Date   Bipolar disorder (HCC)    Insomnia    Sleep apnea     Past Surgical History:  Procedure Laterality Date   CHOLECYSTECTOMY      Social History   Socioeconomic History   Marital status: Single    Spouse name: Not on file   Number of children: Not on file   Years of education: Not on file   Highest education level: Not on file  Occupational History   Not on file  Tobacco Use   Smoking status: Former    Packs/day: 1.00    Years: 2.00    Additional pack years: 0.00    Total pack years: 2.00    Types: Cigarettes   Smokeless tobacco: Former  Building services engineer Use: Never used  Substance and Sexual Activity   Alcohol use: Not Currently   Drug use: Not Currently   Sexual activity: Not on file  Other Topics Concern   Not on file  Social History Narrative   Not on file   Social Determinants of Health   Financial Resource Strain: Not on file  Food Insecurity: Not on file  Transportation Needs: Not on file  Physical Activity: Not on file  Stress: Not on file  Social Connections: Not on file  Intimate Partner Violence: Not on file    Family History  Problem Relation Age of Onset   Breast cancer Mother     Allergies  Allergen Reactions   Lithium    Morphine Other (See Comments)    Short of breath   Risperidone    Ziprasidone Hcl Anxiety    Review of Systems  Constitutional: Negative.   HENT: Negative.    Eyes: Negative.   Respiratory:  Negative.  Negative for cough and shortness of breath.   Cardiovascular: Negative.  Negative for chest pain, palpitations and leg swelling.  Gastrointestinal: Negative.  Negative for abdominal pain, constipation, diarrhea, heartburn, nausea and vomiting.  Genitourinary: Negative.  Negative for dysuria and flank pain.  Musculoskeletal: Negative.  Negative for joint pain and myalgias.  Skin: Negative.   Neurological: Negative.  Negative for dizziness and headaches.  Endo/Heme/Allergies: Negative.   Psychiatric/Behavioral: Negative.  Negative for depression and suicidal ideas. The patient is not nervous/anxious.        Objective:   BP 112/82   Pulse 91   Ht 6' (1.829 m)   Wt 285 lb (129.3 kg)   SpO2 (!) 85%   BMI 38.65 kg/m   Vitals:   06/01/23 1003  BP: 112/82  Pulse: 91  Height: 6' (1.829 m)  Weight: 285 lb (129.3 kg)  SpO2: (!) 85%  BMI (Calculated): 38.64    Physical Exam Vitals and nursing note reviewed.  Constitutional:      Appearance: Normal appearance.  HENT:     Head: Normocephalic and atraumatic.     Nose: Nose normal.     Mouth/Throat:  Mouth: Mucous membranes are moist.     Pharynx: Oropharynx is clear.  Eyes:     Conjunctiva/sclera: Conjunctivae normal.     Pupils: Pupils are equal, round, and reactive to light.  Cardiovascular:     Rate and Rhythm: Normal rate and regular rhythm.     Pulses: Normal pulses.     Heart sounds: Normal heart sounds.  Pulmonary:     Effort: Pulmonary effort is normal.     Breath sounds: Normal breath sounds.  Abdominal:     General: Bowel sounds are normal.     Palpations: Abdomen is soft.  Musculoskeletal:        General: Normal range of motion.     Cervical back: Normal range of motion.  Skin:    General: Skin is warm and dry.  Neurological:     General: No focal deficit present.     Mental Status: He is alert and oriented to person, place, and time.  Psychiatric:        Mood and Affect: Mood normal.         Behavior: Behavior normal.        Judgment: Judgment normal.      Results for orders placed or performed in visit on 06/01/23  POCT CBG (Fasting - Glucose)  Result Value Ref Range   Glucose Fasting, POC 123 (A) 70 - 99 mg/dL    Recent Results (from the past 2160 hour(s))  POCT CBG (Fasting - Glucose)     Status: Abnormal   Collection Time: 06/01/23 10:08 AM  Result Value Ref Range   Glucose Fasting, POC 123 (A) 70 - 99 mg/dL      Assessment & Plan:  Patient feeling well. No complaints today. Did not have fasting blood work done due to transportation issues. Fasting today, orders placed.   Problem List Items Addressed This Visit     Sleep apnea   Abnormal LFTs   Relevant Orders   CMP14+EGFR   CBC With Diff/Platelet   Diabetes mellitus without complication (HCC) - Primary   Relevant Orders   POCT CBG (Fasting - Glucose) (Completed)   CBC With Diff/Platelet   HgB A1c   Mixed hyperlipidemia   Relevant Orders   CMP14+EGFR   CBC With Diff/Platelet   Lipid Profile   Morbid obesity (HCC)   Other Visit Diagnoses     Prostate cancer screening       Relevant Orders   PSA       Return in about 4 months (around 10/02/2023).   Total time spent: 25 minutes  Margaretann Loveless, MD  06/01/2023   This document may have been prepared by [facility] Voice Recognition software and as such may include unintentional dictation errors.

## 2023-06-02 ENCOUNTER — Other Ambulatory Visit: Payer: Self-pay | Admitting: Internal Medicine

## 2023-06-02 DIAGNOSIS — R7989 Other specified abnormal findings of blood chemistry: Secondary | ICD-10-CM

## 2023-06-02 LAB — CBC WITH DIFF/PLATELET
Basophils Absolute: 0 10*3/uL (ref 0.0–0.2)
Basos: 0 %
EOS (ABSOLUTE): 0.1 10*3/uL (ref 0.0–0.4)
Eos: 0 %
Hematocrit: 61.8 % — ABNORMAL HIGH (ref 37.5–51.0)
Hemoglobin: 19.9 g/dL — ABNORMAL HIGH (ref 13.0–17.7)
Immature Grans (Abs): 0 10*3/uL (ref 0.0–0.1)
Immature Granulocytes: 0 %
Lymphocytes Absolute: 1.4 10*3/uL (ref 0.7–3.1)
Lymphs: 10 %
MCH: 30.2 pg (ref 26.6–33.0)
MCHC: 32.2 g/dL (ref 31.5–35.7)
MCV: 94 fL (ref 79–97)
Monocytes Absolute: 0.6 10*3/uL (ref 0.1–0.9)
Monocytes: 5 %
Neutrophils Absolute: 11.3 10*3/uL — ABNORMAL HIGH (ref 1.4–7.0)
Neutrophils: 85 %
Platelets: 153 10*3/uL (ref 150–450)
RBC: 6.6 x10E6/uL — ABNORMAL HIGH (ref 4.14–5.80)
RDW: 14.5 % (ref 11.6–15.4)
WBC: 13.4 10*3/uL — ABNORMAL HIGH (ref 3.4–10.8)

## 2023-06-02 LAB — LIPID PANEL
Chol/HDL Ratio: 2.9 ratio (ref 0.0–5.0)
Cholesterol, Total: 82 mg/dL — ABNORMAL LOW (ref 100–199)
HDL: 28 mg/dL — ABNORMAL LOW (ref >39)
LDL Chol Calc (NIH): 37 mg/dL (ref 0–99)
Triglycerides: 80 mg/dL (ref 0–149)
VLDL Cholesterol Cal: 17 mg/dL (ref 5–40)

## 2023-06-02 LAB — CMP14+EGFR
ALT: 20 IU/L (ref 0–44)
AST: 23 IU/L (ref 0–40)
Albumin: 4.2 g/dL (ref 3.8–4.9)
Alkaline Phosphatase: 68 IU/L (ref 44–121)
BUN/Creatinine Ratio: 17 (ref 9–20)
BUN: 15 mg/dL (ref 6–24)
Bilirubin Total: 0.3 mg/dL (ref 0.0–1.2)
CO2: 22 mmol/L (ref 20–29)
Calcium: 10 mg/dL (ref 8.7–10.2)
Chloride: 98 mmol/L (ref 96–106)
Creatinine, Ser: 0.87 mg/dL (ref 0.76–1.27)
Globulin, Total: 2.7 g/dL (ref 1.5–4.5)
Glucose: 116 mg/dL — ABNORMAL HIGH (ref 70–99)
Potassium: 4.6 mmol/L (ref 3.5–5.2)
Sodium: 139 mmol/L (ref 134–144)
Total Protein: 6.9 g/dL (ref 6.0–8.5)
eGFR: 100 mL/min/{1.73_m2} (ref >59)

## 2023-06-02 LAB — HEMOGLOBIN A1C
Est. average glucose Bld gHb Est-mCnc: 154 mg/dL
Hgb A1c MFr Bld: 7 % — ABNORMAL HIGH (ref 4.8–5.6)

## 2023-06-02 LAB — PSA: Prostate Specific Ag, Serum: 1.4 ng/mL (ref 0.0–4.0)

## 2023-06-03 NOTE — Progress Notes (Signed)
Patient notified

## 2023-06-03 NOTE — Progress Notes (Signed)
Established Patient Office Visit  Subjective:  Patient ID: Randy Velez, male    DOB: [birth year]  Age: 57 y.o. MRN: [id number]  Chief Complaint  Patient presents with  . Follow-up    3 month follow up    Patient in office for 3 month follow up. Blood work not done. Patient is fasting today. Patient is feeling well, has no complaints today. Reports he is compliant with his medications. Follows with behavioral health for schizophrenia.   No other concerns at this time.   Past Medical History:  Diagnosis Date  . Bipolar disorder (HCC)   . Insomnia   . Sleep apnea     Past Surgical History:  Procedure Laterality Date  . CHOLECYSTECTOMY      Social History   Socioeconomic History  . Marital status: Single    Spouse name: Not on file  . Number of children: Not on file  . Years of education: Not on file  . Highest education level: Not on file  Occupational History  . Not on file  Tobacco Use  . Smoking status: Former    Packs/day: 1.00    Years: 2.00    Additional pack years: 0.00    Total pack years: 2.00    Types: Cigarettes  . Smokeless tobacco: Former  Advertising account planner  . Vaping Use: Never used  Substance and Sexual Activity  . Alcohol use: Not Currently  . Drug use: Not Currently  . Sexual activity: Not on file  Other Topics Concern  . Not on file  Social History Narrative  . Not on file   Social Determinants of Health   Financial Resource Strain: Not on file  Food Insecurity: Not on file  Transportation Needs: Not on file  Physical Activity: Not on file  Stress: Not on file  Social Connections: Not on file  Intimate Partner Violence: Not on file    Family History  Problem Relation Age of Onset  . Breast cancer Mother     Allergies  Allergen Reactions  . Lithium   . Morphine Other (See Comments)    Short of breath  . Risperidone   . Ziprasidone Hcl Anxiety    Review of Systems  Constitutional: Negative.   HENT: Negative.    Eyes:  Negative.   Respiratory: Negative.  Negative for cough and shortness of breath.   Cardiovascular: Negative.  Negative for chest pain, palpitations and leg swelling.  Gastrointestinal: Negative.  Negative for abdominal pain, constipation, diarrhea, heartburn, nausea and vomiting.  Genitourinary: Negative.  Negative for dysuria and flank pain.  Musculoskeletal: Negative.  Negative for joint pain and myalgias.  Skin: Negative.   Neurological: Negative.  Negative for dizziness and headaches.  Endo/Heme/Allergies: Negative.   Psychiatric/Behavioral: Negative.  Negative for depression and suicidal ideas. The patient is not nervous/anxious.        Objective:   BP 112/82   Pulse 91   Ht 6' (1.829 m)   Wt 285 lb (129.3 kg)   SpO2 (!) 85%   BMI 38.65 kg/m   Vitals:   06/01/23 1003  BP: 112/82  Pulse: 91  Height: 6' (1.829 m)  Weight: 285 lb (129.3 kg)  SpO2: (!) 85%  BMI (Calculated): 38.64    Physical Exam Vitals and nursing note reviewed.  Constitutional:      Appearance: Normal appearance.  HENT:     Head: Normocephalic and atraumatic.     Nose: Nose normal.     Mouth/Throat:  Mouth: Mucous membranes are moist.     Pharynx: Oropharynx is clear.  Eyes:     Conjunctiva/sclera: Conjunctivae normal.     Pupils: Pupils are equal, round, and reactive to light.  Cardiovascular:     Rate and Rhythm: Normal rate and regular rhythm.     Pulses: Normal pulses.     Heart sounds: Normal heart sounds.  Pulmonary:     Effort: Pulmonary effort is normal.     Breath sounds: Normal breath sounds.  Abdominal:     General: Bowel sounds are normal.     Palpations: Abdomen is soft.  Musculoskeletal:        General: Normal range of motion.     Cervical back: Normal range of motion.  Skin:    General: Skin is warm and dry.  Neurological:     General: No focal deficit present.     Mental Status: He is alert and oriented to person, place, and time.  Psychiatric:        Mood and  Affect: Mood normal.        Behavior: Behavior normal.        Judgment: Judgment normal.     Results for orders placed or performed in visit on 06/01/23  CMP14+EGFR  Result Value Ref Range   Glucose 116 (H) 70 - 99 mg/dL   BUN 15 6 - 24 mg/dL   Creatinine, Ser 8.29 0.76 - 1.27 mg/dL   eGFR 562 >13 YQ/MVH/8.46   BUN/Creatinine Ratio 17 9 - 20   Sodium 139 134 - 144 mmol/L   Potassium 4.6 3.5 - 5.2 mmol/L   Chloride 98 96 - 106 mmol/L   CO2 22 20 - 29 mmol/L   Calcium 10.0 8.7 - 10.2 mg/dL   Total Protein 6.9 6.0 - 8.5 g/dL   Albumin 4.2 3.8 - 4.9 g/dL   Globulin, Total 2.7 1.5 - 4.5 g/dL   Bilirubin Total 0.3 0.0 - 1.2 mg/dL   Alkaline Phosphatase 68 44 - 121 IU/L   AST 23 0 - 40 IU/L   ALT 20 0 - 44 IU/L  CBC With Diff/Platelet  Result Value Ref Range   WBC 13.4 (H) 3.4 - 10.8 x10E3/uL   RBC 6.60 (H) 4.14 - 5.80 x10E6/uL   Hemoglobin 19.9 (H) 13.0 - 17.7 g/dL   Hematocrit 96.2 (H) 95.2 - 51.0 %   MCV 94 79 - 97 fL   MCH 30.2 26.6 - 33.0 pg   MCHC 32.2 31.5 - 35.7 g/dL   RDW 84.1 32.4 - 40.1 %   Platelets 153 150 - 450 x10E3/uL   Neutrophils 85 Not Estab. %   Lymphs 10 Not Estab. %   Monocytes 5 Not Estab. %   Eos 0 Not Estab. %   Basos 0 Not Estab. %   Neutrophils Absolute 11.3 (H) 1.4 - 7.0 x10E3/uL   Lymphocytes Absolute 1.4 0.7 - 3.1 x10E3/uL   Monocytes Absolute 0.6 0.1 - 0.9 x10E3/uL   EOS (ABSOLUTE) 0.1 0.0 - 0.4 x10E3/uL   Basophils Absolute 0.0 0.0 - 0.2 x10E3/uL   Immature Granulocytes 0 Not Estab. %   Immature Grans (Abs) 0.0 0.0 - 0.1 x10E3/uL  Lipid Profile  Result Value Ref Range   Cholesterol, Total 82 (L) 100 - 199 mg/dL   Triglycerides 80 0 - 149 mg/dL   HDL 28 (L) >02 mg/dL   VLDL Cholesterol Cal 17 5 - 40 mg/dL   LDL Chol Calc (NIH) 37 0 - 99 mg/dL  Chol/HDL Ratio 2.9 0.0 - 5.0 ratio  HgB A1c  Result Value Ref Range   Hgb A1c MFr Bld 7.0 (H) 4.8 - 5.6 %   Est. average glucose Bld gHb Est-mCnc 154 mg/dL  PSA  Result Value Ref Range    Prostate Specific Ag, Serum 1.4 0.0 - 4.0 ng/mL  POCT CBG (Fasting - Glucose)  Result Value Ref Range   Glucose Fasting, POC 123 (A) 70 - 99 mg/dL    Recent Results (from the past 2160 hour(s))  POCT CBG (Fasting - Glucose)     Status: Abnormal   Collection Time: 06/01/23 10:08 AM  Result Value Ref Range   Glucose Fasting, POC 123 (A) 70 - 99 mg/dL  NGE95+MWUX     Status: Abnormal   Collection Time: 06/01/23 10:38 AM  Result Value Ref Range   Glucose 116 (H) 70 - 99 mg/dL   BUN 15 6 - 24 mg/dL   Creatinine, Ser 3.24 0.76 - 1.27 mg/dL   eGFR 401 >02 VO/ZDG/6.44   BUN/Creatinine Ratio 17 9 - 20   Sodium 139 134 - 144 mmol/L   Potassium 4.6 3.5 - 5.2 mmol/L   Chloride 98 96 - 106 mmol/L   CO2 22 20 - 29 mmol/L   Calcium 10.0 8.7 - 10.2 mg/dL   Total Protein 6.9 6.0 - 8.5 g/dL   Albumin 4.2 3.8 - 4.9 g/dL   Globulin, Total 2.7 1.5 - 4.5 g/dL   Bilirubin Total 0.3 0.0 - 1.2 mg/dL   Alkaline Phosphatase 68 44 - 121 IU/L   AST 23 0 - 40 IU/L   ALT 20 0 - 44 IU/L  CBC With Diff/Platelet     Status: Abnormal   Collection Time: 06/01/23 10:38 AM  Result Value Ref Range   WBC 13.4 (H) 3.4 - 10.8 x10E3/uL   RBC 6.60 (H) 4.14 - 5.80 x10E6/uL   Hemoglobin 19.9 (H) 13.0 - 17.7 g/dL   Hematocrit 03.4 (H) 74.2 - 51.0 %   MCV 94 79 - 97 fL   MCH 30.2 26.6 - 33.0 pg   MCHC 32.2 31.5 - 35.7 g/dL   RDW 59.5 63.8 - 75.6 %   Platelets 153 150 - 450 x10E3/uL   Neutrophils 85 Not Estab. %   Lymphs 10 Not Estab. %   Monocytes 5 Not Estab. %   Eos 0 Not Estab. %   Basos 0 Not Estab. %   Neutrophils Absolute 11.3 (H) 1.4 - 7.0 x10E3/uL   Lymphocytes Absolute 1.4 0.7 - 3.1 x10E3/uL   Monocytes Absolute 0.6 0.1 - 0.9 x10E3/uL   EOS (ABSOLUTE) 0.1 0.0 - 0.4 x10E3/uL   Basophils Absolute 0.0 0.0 - 0.2 x10E3/uL   Immature Granulocytes 0 Not Estab. %   Immature Grans (Abs) 0.0 0.0 - 0.1 x10E3/uL  Lipid Profile     Status: Abnormal   Collection Time: 06/01/23 10:38 AM  Result Value Ref Range    Cholesterol, Total 82 (L) 100 - 199 mg/dL   Triglycerides 80 0 - 149 mg/dL   HDL 28 (L) >43 mg/dL   VLDL Cholesterol Cal 17 5 - 40 mg/dL   LDL Chol Calc (NIH) 37 0 - 99 mg/dL   Chol/HDL Ratio 2.9 0.0 - 5.0 ratio    Comment:                                   T. Chol/HDL Ratio  Men  Women                               1/2 Avg.Risk  3.4    3.3                                   Avg.Risk  5.0    4.4                                2X Avg.Risk  9.6    7.1                                3X Avg.Risk 23.4   11.0   HgB A1c     Status: Abnormal   Collection Time: 06/01/23 10:38 AM  Result Value Ref Range   Hgb A1c MFr Bld 7.0 (H) 4.8 - 5.6 %    Comment:          Prediabetes: 5.7 - 6.4          Diabetes: >6.4          Glycemic control for adults with diabetes: <7.0    Est. average glucose Bld gHb Est-mCnc 154 mg/dL  PSA     Status: None   Collection Time: 06/01/23 10:38 AM  Result Value Ref Range   Prostate Specific Ag, Serum 1.4 0.0 - 4.0 ng/mL    Comment: Roche ECLIA methodology. According to the American Urological Association, Serum PSA should decrease and remain at undetectable levels after radical prostatectomy. The AUA defines biochemical recurrence as an initial PSA value 0.2 ng/mL or greater followed by a subsequent confirmatory PSA value 0.2 ng/mL or greater. Values obtained with different assay methods or kits cannot be used interchangeably. Results cannot be interpreted as absolute evidence of the presence or absence of malignant disease.       Assessment & Plan:  Patient feeling well. No complaints today. Did not have fasting blood work done due to transportation issues. Fasting today, orders placed.   Problem List Items Addressed This Visit       Respiratory   Sleep apnea     Endocrine   Diabetes mellitus without complication (HCC) - Primary   Relevant Orders   POCT CBG (Fasting - Glucose) (Completed)   CBC With  Diff/Platelet (Completed)   HgB A1c (Completed)     Other   Abnormal LFTs   Relevant Orders   CMP14+EGFR (Completed)   CBC With Diff/Platelet (Completed)   Mixed hyperlipidemia   Relevant Orders   CMP14+EGFR (Completed)   CBC With Diff/Platelet (Completed)   Lipid Profile (Completed)   Morbid obesity (HCC)   Other Visit Diagnoses     Prostate cancer screening       Relevant Orders   PSA (Completed)       Return in about 4 months (around 10/02/2023).   Total time spent: 25 minutes  [employer], NP  06/01/2023   This document may have been prepared by Dragon Voice Recognition software and as such may include unintentional dictation errors.

## 2023-06-09 ENCOUNTER — Inpatient Hospital Stay: Payer: Medicare PPO | Attending: Internal Medicine | Admitting: Internal Medicine

## 2023-06-09 ENCOUNTER — Encounter: Payer: Self-pay | Admitting: Internal Medicine

## 2023-06-09 ENCOUNTER — Inpatient Hospital Stay: Payer: Medicare PPO

## 2023-06-09 VITALS — BP 107/74 | HR 79 | Temp 97.9°F | Resp 20 | Wt 283.6 lb

## 2023-06-09 DIAGNOSIS — D751 Secondary polycythemia: Secondary | ICD-10-CM

## 2023-06-09 DIAGNOSIS — F319 Bipolar disorder, unspecified: Secondary | ICD-10-CM | POA: Insufficient documentation

## 2023-06-09 DIAGNOSIS — Z888 Allergy status to other drugs, medicaments and biological substances status: Secondary | ICD-10-CM | POA: Diagnosis not present

## 2023-06-09 DIAGNOSIS — E785 Hyperlipidemia, unspecified: Secondary | ICD-10-CM | POA: Insufficient documentation

## 2023-06-09 DIAGNOSIS — Z79899 Other long term (current) drug therapy: Secondary | ICD-10-CM | POA: Insufficient documentation

## 2023-06-09 DIAGNOSIS — Z87891 Personal history of nicotine dependence: Secondary | ICD-10-CM | POA: Diagnosis not present

## 2023-06-09 DIAGNOSIS — Z803 Family history of malignant neoplasm of breast: Secondary | ICD-10-CM | POA: Insufficient documentation

## 2023-06-09 DIAGNOSIS — G4733 Obstructive sleep apnea (adult) (pediatric): Secondary | ICD-10-CM | POA: Diagnosis not present

## 2023-06-09 DIAGNOSIS — Z885 Allergy status to narcotic agent status: Secondary | ICD-10-CM | POA: Insufficient documentation

## 2023-06-09 DIAGNOSIS — Z9049 Acquired absence of other specified parts of digestive tract: Secondary | ICD-10-CM | POA: Insufficient documentation

## 2023-06-09 DIAGNOSIS — D72829 Elevated white blood cell count, unspecified: Secondary | ICD-10-CM | POA: Diagnosis not present

## 2023-06-09 DIAGNOSIS — Z7984 Long term (current) use of oral hypoglycemic drugs: Secondary | ICD-10-CM | POA: Insufficient documentation

## 2023-06-09 LAB — CBC WITH DIFFERENTIAL/PLATELET
Abs Immature Granulocytes: 0.03 10*3/uL (ref 0.00–0.07)
Basophils Absolute: 0.1 10*3/uL (ref 0.0–0.1)
Basophils Relative: 1 %
Eosinophils Absolute: 0.1 10*3/uL (ref 0.0–0.5)
Eosinophils Relative: 2 %
HCT: 63 % — ABNORMAL HIGH (ref 39.0–52.0)
Hemoglobin: 20.7 g/dL — ABNORMAL HIGH (ref 13.0–17.0)
Immature Granulocytes: 0 %
Lymphocytes Relative: 26 %
Lymphs Abs: 2.3 10*3/uL (ref 0.7–4.0)
MCH: 31.2 pg (ref 26.0–34.0)
MCHC: 32.9 g/dL (ref 30.0–36.0)
MCV: 94.9 fL (ref 80.0–100.0)
Monocytes Absolute: 0.7 10*3/uL (ref 0.1–1.0)
Monocytes Relative: 8 %
Neutro Abs: 5.7 10*3/uL (ref 1.7–7.7)
Neutrophils Relative %: 63 %
Platelets: 171 10*3/uL (ref 150–400)
RBC: 6.64 MIL/uL — ABNORMAL HIGH (ref 4.22–5.81)
RDW: 14.9 % (ref 11.5–15.5)
WBC: 8.9 10*3/uL (ref 4.0–10.5)
nRBC: 0 % (ref 0.0–0.2)

## 2023-06-09 NOTE — Progress Notes (Signed)
[facility]  Telephone:(336) [phone] Fax:(336) [phone]  ID: Randy Velez OB: [birth year]  MR#: [id number]  AOZ#:[id number]  Patient Care Team: Orson Eva, NP as PCP - General (Nurse Practitioner)  REFERRING PROVIDER: Dr. Yves Dill  REASON FOR REFERRAL: Polycythemia  HPI: Randy Velez is a 57 y.o. male with past medical history of diabetes, hyperlipidemia, bipolar disorder was referred to hematology for evaluation of polycythemia.  Patient is a remote smoker and quit in 1980s.  He has history of sleep apnea.  Does not use CPAP.  He is not at all interested in trying it again or discussing about any other modalities for management.  No testosterone use.  Denies any shortness of breath, chest pain, vision changes, headaches, dizziness, tingling or numbness in hands or feet.  Labs reviewed. He has polycythemia at least since 2019 with hemoglobin ranging between 18.2-19.9.  WBC elevated to 13.4 on labs from 06/01/2023.  Previously has been largely normal.  Platelets fluctuate between 117 to normal.   REVIEW OF SYSTEMS:   ROS  As per HPI. Otherwise, a complete review of systems is negative.  PAST MEDICAL HISTORY: Past Medical History:  Diagnosis Date   Bipolar disorder (HCC)    Insomnia    Sleep apnea     PAST SURGICAL HISTORY: Past Surgical History:  Procedure Laterality Date   CHOLECYSTECTOMY      FAMILY HISTORY: Family History  Problem Relation Age of Onset   Breast cancer Mother     HEALTH MAINTENANCE: Social History   Tobacco Use   Smoking status: Former    Current packs/day: 1.00    Average packs/day: 1 pack/day for 2.0 years (2.0 ttl pk-yrs)    Types: Cigarettes   Smokeless tobacco: Former  Building services engineer status: Never Used  Substance Use Topics   Alcohol use: Not Currently   Drug use: Not Currently     Allergies  Allergen Reactions   Lithium    Morphine Other (See Comments)    Short of breath   Risperidone     Ziprasidone Hcl Anxiety    Current Outpatient Medications  Medication Sig Dispense Refill   ARIPiprazole (ABILIFY) 20 MG tablet Take 20 mg by mouth at bedtime.      blood glucose meter kit and supplies KIT Dispense based on patient and insurance preference. Use up to four times daily as directed. (FOR ICD-9 250.00, 250.01). 1 each 0   doxepin (SINEQUAN) 100 MG capsule Take 300 mg by mouth at bedtime.      FARXIGA 10 MG TABS tablet Take 10 mg by mouth daily.     ibuprofen (ADVIL) 600 MG tablet Take 1 tablet (600 mg total) by mouth every 6 (six) hours as needed. 30 tablet 0   metFORMIN (GLUCOPHAGE) 1000 MG tablet Take 1 tablet (1,000 mg total) by mouth 2 (two) times daily with a meal. 60 tablet 11   QUEtiapine (SEROQUEL) 400 MG tablet Take 800 mg by mouth at bedtime.      simvastatin (ZOCOR) 20 MG tablet Take 1 tablet (20 mg total) by mouth at bedtime. 30 tablet 11   fluticasone (FLONASE) 50 MCG/ACT nasal spray Place 2 sprays into both nostrils daily. (Patient not taking: Reported on 06/09/2023) 16 g 0   Ipratropium-Albuterol (COMBIVENT) 20-100 MCG/ACT AERS respimat Inhale 1 puff into the lungs every 6 (six) hours as needed for wheezing or shortness of breath. (Patient not taking: Reported on 06/09/2023) 4 g 0   No current facility-administered  medications for this visit.    OBJECTIVE: Vitals:   06/09/23 1344  BP: 107/74  Pulse: 79  Resp: 20  Temp: 97.9 F (36.6 C)  SpO2: 94%     Body mass index is 38.46 kg/m.      General: Well-developed, well-nourished, no acute distress. Eyes: Pink conjunctiva, anicteric sclera. HEENT: Normocephalic, moist mucous membranes, clear oropharnyx. Lungs: Clear to auscultation bilaterally. Heart: Regular rate and rhythm. No rubs, murmurs, or gallops. Abdomen: Soft, nontender, nondistended. No organomegaly noted, normoactive bowel sounds. Musculoskeletal: No edema, cyanosis, or clubbing. Neuro: Alert, answering all questions appropriately. Cranial nerves  grossly intact. Skin: No rashes or petechiae noted. Psych: Normal affect. Lymphatics: No cervical, calvicular, axillary or inguinal LAD.   LAB RESULTS:  Lab Results  Component Value Date   NA 139 06/01/2023   K 4.6 06/01/2023   CL 98 06/01/2023   CO2 22 06/01/2023   GLUCOSE 116 (H) 06/01/2023   BUN 15 06/01/2023   CREATININE 0.87 06/01/2023   CALCIUM 10.0 06/01/2023   PROT 6.9 06/01/2023   ALBUMIN 4.2 06/01/2023   AST 23 06/01/2023   ALT 20 06/01/2023   ALKPHOS 68 06/01/2023   BILITOT 0.3 06/01/2023   GFRNONAA >60 11/30/2019   GFRAA >60 11/30/2019    Lab Results  Component Value Date   WBC 13.4 (H) 06/01/2023   NEUTROABS 11.3 (H) 06/01/2023   HGB 19.9 (H) 06/01/2023   HCT 61.8 (H) 06/01/2023   MCV 94 06/01/2023   PLT 153 06/01/2023    No results found for: "TIBC", "FERRITIN", "IRONPCTSAT"   STUDIES: No results found.  ASSESSMENT AND PLAN:   Randy Velez is a 57 y.o. male with pmh of diabetes, hyperlipidemia, bipolar disorder was referred to hematology for evaluation of polycythemia.  # Polycythemia # Leukocytosis # OSA, not on CPAP -Of unclear etiology. He has polycythemia at least since 2019 with hemoglobin ranging between 18.2-19.9.  WBC elevated to 13.4 on labs from 06/01/2023.  Previously has been largely normal.  Platelets fluctuate between 117 to normal.  -I discussed with the patient about potential causes of polycythemia.  In his case untreated OSA versus MPN's although my suspicion is low.  I will obtain CBC with differential, check EPO level and mutational testing to rule that out.  He is a remote smoker quit in 1980s.  We discussed about long-term consequences of untreated OSA such as cardiac dysfunction, pulmonary hypertension but patient clearly expressed he did not want to try any intervention for his sleep apnea.  -I will follow-up with him in 3 weeks to discuss lab results.  If this is all secondary from OSA, I would consider phlebotomy only if  he is symptomatic.  He is not symptomatic at this point.   Orders Placed This Encounter  Procedures   CBC with Differential/Platelet   Erythropoietin   JAK2 V617F rfx CALR/MPL/E12-15   RTC in 3 weeks for MD visit to discuss labs.  Patient expressed understanding and was in agreement with this plan. He also understands that He can call clinic at any time with any questions, concerns, or complaints.   I spent a total of 45 minutes reviewing chart data, face-to-face evaluation with the patient, counseling and coordination of care as detailed above.  Michaelyn Barter, MD   06/09/2023 1:56 PM

## 2023-06-10 LAB — ERYTHROPOIETIN: Erythropoietin: 31.2 m[IU]/mL — ABNORMAL HIGH (ref 2.6–18.5)

## 2023-06-17 LAB — CALR +MPL + E12-E15  (REFLEX)

## 2023-06-17 LAB — JAK2 V617F RFX CALR/MPL/E12-15

## 2023-06-30 ENCOUNTER — Inpatient Hospital Stay: Payer: Medicare PPO | Attending: Internal Medicine | Admitting: Internal Medicine

## 2023-06-30 VITALS — BP 119/82 | HR 84 | Temp 99.2°F | Wt 283.0 lb

## 2023-06-30 DIAGNOSIS — D72829 Elevated white blood cell count, unspecified: Secondary | ICD-10-CM | POA: Diagnosis not present

## 2023-06-30 DIAGNOSIS — D751 Secondary polycythemia: Secondary | ICD-10-CM | POA: Diagnosis not present

## 2023-06-30 DIAGNOSIS — E785 Hyperlipidemia, unspecified: Secondary | ICD-10-CM | POA: Diagnosis not present

## 2023-06-30 DIAGNOSIS — G4730 Sleep apnea, unspecified: Secondary | ICD-10-CM

## 2023-06-30 DIAGNOSIS — Z888 Allergy status to other drugs, medicaments and biological substances status: Secondary | ICD-10-CM | POA: Diagnosis not present

## 2023-06-30 DIAGNOSIS — G4733 Obstructive sleep apnea (adult) (pediatric): Secondary | ICD-10-CM | POA: Diagnosis not present

## 2023-06-30 DIAGNOSIS — Z803 Family history of malignant neoplasm of breast: Secondary | ICD-10-CM | POA: Diagnosis not present

## 2023-06-30 DIAGNOSIS — E119 Type 2 diabetes mellitus without complications: Secondary | ICD-10-CM | POA: Insufficient documentation

## 2023-06-30 DIAGNOSIS — F319 Bipolar disorder, unspecified: Secondary | ICD-10-CM | POA: Diagnosis not present

## 2023-06-30 DIAGNOSIS — Z9049 Acquired absence of other specified parts of digestive tract: Secondary | ICD-10-CM | POA: Diagnosis not present

## 2023-06-30 DIAGNOSIS — Z885 Allergy status to narcotic agent status: Secondary | ICD-10-CM | POA: Diagnosis not present

## 2023-06-30 DIAGNOSIS — Z79899 Other long term (current) drug therapy: Secondary | ICD-10-CM | POA: Insufficient documentation

## 2023-06-30 DIAGNOSIS — Z87891 Personal history of nicotine dependence: Secondary | ICD-10-CM | POA: Insufficient documentation

## 2023-06-30 NOTE — Progress Notes (Signed)
Patient says that he is doing about the same no new symptoms to report today. He just really wants to know and understand his diagnosis.

## 2023-07-02 NOTE — Progress Notes (Signed)
[facility]  Telephone:(336) [phone] Fax:(336) [phone]  ID: Randy Velez OB: [birth year]  MR#: [id number]  AOZ#:[id number]  Patient Care Team: Randy Eva, NP as PCP - General (Nurse Practitioner) Randy Barter, MD as Consulting Physician (Oncology)  REFERRING PROVIDER: Dr. Yves Dill  REASON FOR REFERRAL: Polycythemia  HPI: Randy Velez is a 57 y.o. male with past medical history of diabetes, hyperlipidemia, bipolar disorder was referred to hematology for evaluation of polycythemia.  Patient is a remote smoker and quit in 1980s.  He has history of sleep apnea.  Does not use CPAP.  He is not at all interested in trying it again or discussing about any other modalities for management.  No testosterone use.  Denies any shortness of breath, chest pain, vision changes, headaches, dizziness, tingling or numbness in hands or feet.  Labs reviewed. He has polycythemia at least since 2019 with hemoglobin ranging between 18.2-19.9.  WBC elevated to 13.4 on labs from 06/01/2023.  Previously has been largely normal.  Platelets fluctuate between 117 to normal.  Interval history Patient was seen today as follow-up for polycythemia and labs. He is doing well overall.  Denies any new concerns.  REVIEW OF SYSTEMS:   ROS  As per HPI. Otherwise, a complete review of systems is negative.  PAST MEDICAL HISTORY: Past Medical History:  Diagnosis Date   Bipolar disorder (HCC)    Insomnia    Sleep apnea     PAST SURGICAL HISTORY: Past Surgical History:  Procedure Laterality Date   CHOLECYSTECTOMY      FAMILY HISTORY: Family History  Problem Relation Age of Onset   Breast cancer Mother     HEALTH MAINTENANCE: Social History   Tobacco Use   Smoking status: Former    Current packs/day: 1.00    Average packs/day: 1 pack/day for 2.0 years (2.0 ttl pk-yrs)    Types: Cigarettes   Smokeless tobacco: Former  Building services engineer status: Never Used  Substance  Use Topics   Alcohol use: Not Currently   Drug use: Not Currently     Allergies  Allergen Reactions   Lithium    Morphine Other (See Comments)    Short of breath   Risperidone    Ziprasidone Hcl Anxiety    Current Outpatient Medications  Medication Sig Dispense Refill   ARIPiprazole (ABILIFY) 20 MG tablet Take 20 mg by mouth at bedtime.      blood glucose meter kit and supplies KIT Dispense based on patient and insurance preference. Use up to four times daily as directed. (FOR ICD-9 250.00, 250.01). 1 each 0   doxepin (SINEQUAN) 100 MG capsule Take 300 mg by mouth at bedtime.      FARXIGA 10 MG TABS tablet Take 10 mg by mouth daily.     fluticasone (FLONASE) 50 MCG/ACT nasal spray Place 2 sprays into both nostrils daily. 16 g 0   ibuprofen (ADVIL) 600 MG tablet Take 1 tablet (600 mg total) by mouth every 6 (six) hours as needed. 30 tablet 0   metFORMIN (GLUCOPHAGE) 1000 MG tablet Take 1 tablet (1,000 mg total) by mouth 2 (two) times daily with a meal. 60 tablet 11   QUEtiapine (SEROQUEL) 400 MG tablet Take 800 mg by mouth at bedtime.      simvastatin (ZOCOR) 20 MG tablet Take 1 tablet (20 mg total) by mouth at bedtime. 30 tablet 11   Ipratropium-Albuterol (COMBIVENT) 20-100 MCG/ACT AERS respimat Inhale 1 puff into the lungs every 6 (six)  hours as needed for wheezing or shortness of breath. (Patient not taking: Reported on 06/09/2023) 4 g 0   No current facility-administered medications for this visit.    OBJECTIVE: Vitals:   06/30/23 1506  BP: 119/82  Pulse: 84  Temp: 99.2 F (37.3 C)  SpO2: 94%     Body mass index is 38.38 kg/m.      General: Well-developed, well-nourished, no acute distress. Eyes: Pink conjunctiva, anicteric sclera. HEENT: Normocephalic, moist mucous membranes, clear oropharnyx. Lungs: Clear to auscultation bilaterally. Heart: Regular rate and rhythm. No rubs, murmurs, or gallops. Abdomen: Soft, nontender, nondistended. No organomegaly noted,  normoactive bowel sounds. Musculoskeletal: No edema, cyanosis, or clubbing. Neuro: Alert, answering all questions appropriately. Cranial nerves grossly intact. Skin: No rashes or petechiae noted. Psych: Normal affect. Lymphatics: No cervical, calvicular, axillary or inguinal LAD.   LAB RESULTS:  Lab Results  Component Value Date   NA 139 06/01/2023   K 4.6 06/01/2023   CL 98 06/01/2023   CO2 22 06/01/2023   GLUCOSE 116 (H) 06/01/2023   BUN 15 06/01/2023   CREATININE 0.87 06/01/2023   CALCIUM 10.0 06/01/2023   PROT 6.9 06/01/2023   ALBUMIN 4.2 06/01/2023   AST 23 06/01/2023   ALT 20 06/01/2023   ALKPHOS 68 06/01/2023   BILITOT 0.3 06/01/2023   GFRNONAA >60 11/30/2019   GFRAA >60 11/30/2019    Lab Results  Component Value Date   WBC 8.9 06/09/2023   NEUTROABS 5.7 06/09/2023   HGB 20.7 (H) 06/09/2023   HCT 63.0 (H) 06/09/2023   MCV 94.9 06/09/2023   PLT 171 06/09/2023    No results found for: "TIBC", "FERRITIN", "IRONPCTSAT"   STUDIES: No results found.  ASSESSMENT AND PLAN:   [name] is a 57 y.o. male with pmh of diabetes, hyperlipidemia, bipolar disorder was referred to hematology for evaluation of polycythemia.  # Polycythemia # Leukocytosis # OSA, not on CPAP -He has polycythemia at least since 2019 with hemoglobin ranging between 18.2-19.9.  WBC elevated to 13.4 on labs from 06/01/2023.  Previously has been largely normal.  Platelets fluctuate between 117 to normal.  -Workup-mutational analysis with JAK2 V617F/ exon 21/ CALR/ MPL is negative.  EPO elevated at 31.2.  Repeat CBC with differential showed hemoglobin of 20.7.  Hematocrit 63. For patients with secondary polycythemia, the focus of management is amelioration of the underlying cause and contributing factors to alleviate symptoms and reduce the risk of thrombosis. There is no specific target Hct for patients with secondary polycythemia. Rather, cautious phlebotomy (eg, removal of 250 mL blood,  replaced by an equal volume of crystalloid) may be evaluated for symptom relief; however, reduction to Hct <55 percent is likely to exacerbate dyspnea or other hypoxic symptoms.   -His polycythemia is likely driven by obstructive sleep apnea.  Patient absolutely declines to use any CPAP or oxygenation to counteract his polycythemia.  Currently he is not symptomatic.  There is no clear guidelines on phlebotomy with secondary causes.  With his hematocrit 63% there is some increased risk of thrombosis.  I did discuss about phlebotomy.  He would like to hold off.  I will follow-up again in 6 weeks for repeat labs.   Orders Placed This Encounter  Procedures   CBC with Differential/Platelet   RTC in 3 weeks for MD visit to discuss labs.  Patient expressed understanding and was in agreement with this plan. He also understands that He can call clinic at any time with any questions, concerns, or  complaints.   I spent a total of 45 minutes reviewing chart data, face-to-face evaluation with the patient, counseling and coordination of care as detailed above.  Randy Barter, MD   07/02/2023 11:29 AM

## 2023-07-13 DIAGNOSIS — F411 Generalized anxiety disorder: Secondary | ICD-10-CM | POA: Diagnosis not present

## 2023-07-13 DIAGNOSIS — F200 Paranoid schizophrenia: Secondary | ICD-10-CM | POA: Diagnosis not present

## 2023-09-08 ENCOUNTER — Other Ambulatory Visit: Payer: Self-pay

## 2023-09-08 MED ORDER — METFORMIN HCL 1000 MG PO TABS: 1000.0000 mg | ORAL_TABLET | Freq: Two times a day (BID) | ORAL | 11 refills | Status: DC

## 2023-10-02 ENCOUNTER — Encounter: Payer: Self-pay | Admitting: Cardiology

## 2023-10-02 ENCOUNTER — Ambulatory Visit (INDEPENDENT_AMBULATORY_CARE_PROVIDER_SITE_OTHER): Payer: Medicare PPO | Admitting: Cardiology

## 2023-10-02 VITALS — BP 130/80 | HR 83 | Ht 72.0 in | Wt 279.6 lb

## 2023-10-02 DIAGNOSIS — E782 Mixed hyperlipidemia: Secondary | ICD-10-CM

## 2023-10-02 DIAGNOSIS — E119 Type 2 diabetes mellitus without complications: Secondary | ICD-10-CM | POA: Diagnosis not present

## 2023-10-02 DIAGNOSIS — Z0130 Encounter for examination of blood pressure without abnormal findings: Secondary | ICD-10-CM

## 2023-10-02 LAB — GLUCOSE, POCT (MANUAL RESULT ENTRY): POC Glucose: 73 mg/dL (ref 70–99)

## 2023-10-02 NOTE — Progress Notes (Signed)
Established Patient Office Visit  Subjective:  Patient ID: Randy Velez, male    DOB: 08/18/1966  Age: 57 y.o. MRN: [id number]  Chief Complaint  Patient presents with   Follow-up    4 month follow up    Patient in office for 4 month follow up. Patient reports feeling well. No complaints today. Patient is fasting, will order blood work and call with results.    No other concerns at this time.   Past Medical History:  Diagnosis Date   Bipolar disorder (HCC)    Insomnia    Sleep apnea     Past Surgical History:  Procedure Laterality Date   CHOLECYSTECTOMY      Social History   Socioeconomic History   Marital status: Single    Spouse name: Not on file   Number of children: Not on file   Years of education: Not on file   Highest education level: Not on file  Occupational History   Not on file  Tobacco Use   Smoking status: Former    Current packs/day: 1.00    Average packs/day: 1 pack/day for 2.0 years (2.0 ttl pk-yrs)    Types: Cigarettes   Smokeless tobacco: Former  Building services engineer status: Never Used  Substance and Sexual Activity   Alcohol use: Not Currently   Drug use: Not Currently   Sexual activity: Yes    Birth control/protection: None  Other Topics Concern   Not on file  Social History Narrative   Not on file   Social Determinants of Health   Financial Resource Strain: Not on file  Food Insecurity: Not on file  Transportation Needs: Not on file  Physical Activity: Not on file  Stress: Not on file  Social Connections: Not on file  Intimate Partner Violence: Not on file    Family History  Problem Relation Age of Onset   Breast cancer Mother     Allergies  Allergen Reactions   Lithium    Morphine Other (See Comments)    Short of breath   Risperidone    Ziprasidone Hcl Anxiety    Review of Systems  Constitutional: Negative.   HENT: Negative.    Eyes: Negative.   Respiratory: Negative.  Negative for shortness of breath.    Cardiovascular: Negative.  Negative for chest pain.  Gastrointestinal: Negative.  Negative for abdominal pain, constipation and diarrhea.  Genitourinary: Negative.   Musculoskeletal:  Negative for joint pain and myalgias.  Skin: Negative.   Neurological: Negative.  Negative for dizziness and headaches.  Endo/Heme/Allergies: Negative.   All other systems reviewed and are negative.      Objective:   BP 130/80   Pulse 83   Ht 6' (1.829 m)   Wt 279 lb 9.6 oz (126.8 kg)   SpO2 (!) 86%   BMI 37.92 kg/m   Vitals:   10/02/23 1034  BP: 130/80  Pulse: 83  Height: 6' (1.829 m)  Weight: 279 lb 9.6 oz (126.8 kg)  SpO2: (!) 86%  BMI (Calculated): 37.91    Physical Exam Nursing note reviewed.  Constitutional:      Appearance: Normal appearance. He is normal weight.  HENT:     Head: Normocephalic and atraumatic.     Nose: Nose normal.     Mouth/Throat:     Mouth: Mucous membranes are moist.     Pharynx: Oropharynx is clear.  Eyes:     Extraocular Movements: Extraocular movements intact.     Conjunctiva/sclera:  Conjunctivae normal.     Pupils: Pupils are equal, round, and reactive to light.  Cardiovascular:     Rate and Rhythm: Normal rate and regular rhythm.     Pulses: Normal pulses.     Heart sounds: Normal heart sounds.  Pulmonary:     Effort: Pulmonary effort is normal.     Breath sounds: Normal breath sounds.  Abdominal:     General: Abdomen is flat. Bowel sounds are normal.     Palpations: Abdomen is soft.  Musculoskeletal:        General: Normal range of motion.     Cervical back: Normal range of motion.  Skin:    General: Skin is warm and dry.  Neurological:     General: No focal deficit present.     Mental Status: He is alert and oriented to person, place, and time.  Psychiatric:        Mood and Affect: Mood normal.        Behavior: Behavior normal.        Thought Content: Thought content normal.        Judgment: Judgment normal.      Results for  orders placed or performed in visit on 10/02/23  POCT Glucose (CBG)  Result Value Ref Range   POC Glucose 73 70 - 99 mg/dl    Recent Results (from the past 2160 hour(s))  POCT Glucose (CBG)     Status: Normal   Collection Time: 10/02/23 10:42 AM  Result Value Ref Range   POC Glucose 73 70 - 99 mg/dl      Assessment & Plan:  Fasting lab work today  Problem List Items Addressed This Visit       Endocrine   Diabetes mellitus without complication (HCC) - Primary   Relevant Orders   POCT Glucose (CBG) (Completed)   CMP14+EGFR   Hemoglobin A1c     Other   Mixed hyperlipidemia   Relevant Orders   Lipid Profile   CMP14+EGFR    No follow-ups on file.   Total time spent: 25 minutes  Google, NP  10/02/2023   This document may have been prepared by Dragon Voice Recognition software and as such may include unintentional dictation errors.

## 2023-10-03 LAB — CMP14+EGFR
ALT: 23 [IU]/L (ref 0–44)
AST: 24 [IU]/L (ref 0–40)
Albumin: 4.3 g/dL (ref 3.8–4.9)
Alkaline Phosphatase: 65 [IU]/L (ref 44–121)
BUN/Creatinine Ratio: 22 — ABNORMAL HIGH (ref 9–20)
BUN: 16 mg/dL (ref 6–24)
Bilirubin Total: 0.4 mg/dL (ref 0.0–1.2)
CO2: 23 mmol/L (ref 20–29)
Calcium: 9.7 mg/dL (ref 8.7–10.2)
Chloride: 101 mmol/L (ref 96–106)
Creatinine, Ser: 0.74 mg/dL — ABNORMAL LOW (ref 0.76–1.27)
Globulin, Total: 2.9 g/dL (ref 1.5–4.5)
Glucose: 83 mg/dL (ref 70–99)
Potassium: 4.5 mmol/L (ref 3.5–5.2)
Sodium: 142 mmol/L (ref 134–144)
Total Protein: 7.2 g/dL (ref 6.0–8.5)
eGFR: 105 mL/min/{1.73_m2} (ref >59)

## 2023-10-03 LAB — LIPID PANEL
Chol/HDL Ratio: 3.4 ratio (ref 0.0–5.0)
Cholesterol, Total: 102 mg/dL (ref 100–199)
HDL: 30 mg/dL — ABNORMAL LOW (ref >39)
LDL Chol Calc (NIH): 54 mg/dL (ref 0–99)
Triglycerides: 91 mg/dL (ref 0–149)
VLDL Cholesterol Cal: 18 mg/dL (ref 5–40)

## 2023-10-03 LAB — HEMOGLOBIN A1C
Est. average glucose Bld gHb Est-mCnc: 148 mg/dL
Hgb A1c MFr Bld: 6.8 % — ABNORMAL HIGH (ref 4.8–5.6)

## 2023-10-07 ENCOUNTER — Other Ambulatory Visit: Payer: Self-pay | Admitting: Cardiology

## 2023-10-07 MED ORDER — FARXIGA 10 MG PO TABS: 10.0000 mg | ORAL_TABLET | Freq: Every day | ORAL | 2 refills | Status: DC

## 2023-10-07 MED ORDER — GLIPIZIDE ER 10 MG PO TB24: 10.0000 mg | ORAL_TABLET | Freq: Every day | ORAL | 2 refills | Status: DC

## 2023-12-28 DIAGNOSIS — F411 Generalized anxiety disorder: Secondary | ICD-10-CM | POA: Diagnosis not present

## 2023-12-28 DIAGNOSIS — F200 Paranoid schizophrenia: Secondary | ICD-10-CM | POA: Diagnosis not present

## 2024-01-30 ENCOUNTER — Other Ambulatory Visit: Payer: Self-pay | Admitting: Cardiology

## 2024-02-01 ENCOUNTER — Encounter: Payer: Self-pay | Admitting: Cardiology

## 2024-02-01 ENCOUNTER — Ambulatory Visit (INDEPENDENT_AMBULATORY_CARE_PROVIDER_SITE_OTHER): Payer: Medicare PPO | Admitting: Cardiology

## 2024-02-01 VITALS — BP 110/66 | HR 92 | Ht 72.0 in | Wt 272.0 lb

## 2024-02-01 DIAGNOSIS — E782 Mixed hyperlipidemia: Secondary | ICD-10-CM

## 2024-02-01 DIAGNOSIS — Z125 Encounter for screening for malignant neoplasm of prostate: Secondary | ICD-10-CM | POA: Diagnosis not present

## 2024-02-01 DIAGNOSIS — Z0130 Encounter for examination of blood pressure without abnormal findings: Secondary | ICD-10-CM

## 2024-02-01 DIAGNOSIS — E119 Type 2 diabetes mellitus without complications: Secondary | ICD-10-CM | POA: Diagnosis not present

## 2024-02-01 DIAGNOSIS — G4730 Sleep apnea, unspecified: Secondary | ICD-10-CM | POA: Diagnosis not present

## 2024-02-01 DIAGNOSIS — Z1329 Encounter for screening for other suspected endocrine disorder: Secondary | ICD-10-CM

## 2024-02-01 NOTE — Progress Notes (Signed)
 Established Patient Office Visit  Subjective:  Patient ID: Randy Velez, male    DOB: 21-Mar-1966  Age: 58 y.o. MRN: [id number]  Chief Complaint  Patient presents with   Follow-up    4 Months Follow Up    Patient in office for 4 month follow up. No recent fasting lab work. Patient doing well, no complaints today.  Will get fasting lab work today.  Patient unsure when previous eye exam was. Patient to call to schedule.     No other concerns at this time.   Past Medical History:  Diagnosis Date   Bipolar disorder (HCC)    Insomnia    Sleep apnea     Past Surgical History:  Procedure Laterality Date   CHOLECYSTECTOMY      Social History   Socioeconomic History   Marital status: Single    Spouse name: Not on file   Number of children: Not on file   Years of education: Not on file   Highest education level: Not on file  Occupational History   Not on file  Tobacco Use   Smoking status: Former    Current packs/day: 1.00    Average packs/day: 1 pack/day for 2.0 years (2.0 ttl pk-yrs)    Types: Cigarettes   Smokeless tobacco: Former  Building services engineer status: Never Used  Substance and Sexual Activity   Alcohol use: Not Currently   Drug use: Not Currently   Sexual activity: Yes    Birth control/protection: None  Other Topics Concern   Not on file  Social History Narrative   Not on file   Social Drivers of Health   Financial Resource Strain: Not on file  Food Insecurity: Not on file  Transportation Needs: Not on file  Physical Activity: Not on file  Stress: Not on file  Social Connections: Not on file  Intimate Partner Violence: Not on file    Family History  Problem Relation Age of Onset   Breast cancer Mother     Allergies  Allergen Reactions   Lithium    Morphine Other (See Comments)    Short of breath   Risperidone    Ziprasidone Hcl Anxiety    Outpatient Medications Prior to Visit  Medication Sig   ARIPiprazole (ABILIFY) 20 MG  tablet Take 20 mg by mouth at bedtime.    blood glucose meter kit and supplies KIT Dispense based on patient and insurance preference. Use up to four times daily as directed. (FOR ICD-9 250.00, 250.01).   doxepin (SINEQUAN) 100 MG capsule Take 300 mg by mouth at bedtime.    FARXIGA 10 MG TABS tablet Take 1 tablet (10 mg total) by mouth daily.   fluticasone (FLONASE) 50 MCG/ACT nasal spray Place 2 sprays into both nostrils daily.   glipiZIDE (GLUCOTROL XL) 10 MG 24 hr tablet Take 1 tablet (10 mg total) by mouth daily with breakfast.   ibuprofen (ADVIL) 600 MG tablet Take 1 tablet (600 mg total) by mouth every 6 (six) hours as needed.   metFORMIN (GLUCOPHAGE) 1000 MG tablet Take 1 tablet (1,000 mg total) by mouth 2 (two) times daily with a meal.   QUEtiapine (SEROQUEL) 400 MG tablet Take 800 mg by mouth at bedtime.    simvastatin (ZOCOR) 20 MG tablet Take 1 tablet (20 mg total) by mouth at bedtime.   No facility-administered medications prior to visit.    Review of Systems  Constitutional: Negative.   HENT: Negative.    Eyes: Negative.  Respiratory: Negative.  Negative for shortness of breath.   Cardiovascular: Negative.  Negative for chest pain.  Gastrointestinal: Negative.  Negative for abdominal pain, constipation and diarrhea.  Genitourinary: Negative.   Musculoskeletal:  Negative for joint pain and myalgias.  Skin: Negative.   Neurological: Negative.  Negative for dizziness and headaches.  Endo/Heme/Allergies: Negative.   Psychiatric/Behavioral:  Negative for depression. The patient is not nervous/anxious.   All other systems reviewed and are negative.      Objective:   BP 110/66   Pulse 92   Ht 6' (1.829 m)   Wt 272 lb (123.4 kg)   SpO2 (!) 88%   BMI 36.89 kg/m   Vitals:   02/01/24 0954  BP: 110/66  Pulse: 92  Height: 6' (1.829 m)  Weight: 272 lb (123.4 kg)  SpO2: (!) 88%  BMI (Calculated): 36.88    Physical Exam Nursing note reviewed.  Constitutional:       Appearance: Normal appearance. He is normal weight.  HENT:     Head: Normocephalic and atraumatic.     Nose: Nose normal.     Mouth/Throat:     Mouth: Mucous membranes are moist.     Pharynx: Oropharynx is clear.  Eyes:     Extraocular Movements: Extraocular movements intact.     Conjunctiva/sclera: Conjunctivae normal.     Pupils: Pupils are equal, round, and reactive to light.  Cardiovascular:     Rate and Rhythm: Normal rate and regular rhythm.     Pulses: Normal pulses.     Heart sounds: Normal heart sounds.  Pulmonary:     Effort: Pulmonary effort is normal.     Breath sounds: Normal breath sounds.  Abdominal:     General: Abdomen is flat. Bowel sounds are normal.     Palpations: Abdomen is soft.  Musculoskeletal:        General: Normal range of motion.     Cervical back: Normal range of motion.  Skin:    General: Skin is warm and dry.  Neurological:     General: No focal deficit present.     Mental Status: He is alert and oriented to person, place, and time.  Psychiatric:        Mood and Affect: Mood normal.        Behavior: Behavior normal.        Thought Content: Thought content normal.        Judgment: Judgment normal.      No results found for any visits on 02/01/24.  No results found for this or any previous visit (from the past 2160 hours).    Assessment & Plan:  Fasting lab work today. Will call with results. Continue all medications.  Schedule DEE.  Problem List Items Addressed This Visit       Respiratory   Sleep apnea     Endocrine   Diabetes mellitus without complication (HCC) - Primary   Relevant Orders   CMP14+EGFR   Hemoglobin A1c     Other   Mixed hyperlipidemia   Relevant Orders   Lipid Profile   Other Visit Diagnoses       Thyroid disorder screening       Relevant Orders   TSH     Prostate cancer screening       Relevant Orders   PSA       Return in about 4 months (around 06/02/2024).   Total time spent: 25  minutes  Google, NP  02/01/2024  This document may have been prepared by [employer] Voice Recognition software and as such may include unintentional dictation errors.

## 2024-02-02 LAB — CMP14+EGFR
ALT: 19 IU/L (ref 0–44)
AST: 22 IU/L (ref 0–40)
Albumin: 4.4 g/dL (ref 3.8–4.9)
Alkaline Phosphatase: 69 IU/L (ref 44–121)
BUN/Creatinine Ratio: 17 (ref 9–20)
BUN: 12 mg/dL (ref 6–24)
Bilirubin Total: 0.5 mg/dL (ref 0.0–1.2)
CO2: 24 mmol/L (ref 20–29)
Calcium: 9.7 mg/dL (ref 8.7–10.2)
Chloride: 100 mmol/L (ref 96–106)
Creatinine, Ser: 0.72 mg/dL — ABNORMAL LOW (ref 0.76–1.27)
Globulin, Total: 2.5 g/dL (ref 1.5–4.5)
Glucose: 85 mg/dL (ref 70–99)
Potassium: 4.4 mmol/L (ref 3.5–5.2)
Sodium: 139 mmol/L (ref 134–144)
Total Protein: 6.9 g/dL (ref 6.0–8.5)
eGFR: 105 mL/min/{1.73_m2} (ref >59)

## 2024-02-02 LAB — HEMOGLOBIN A1C
Est. average glucose Bld gHb Est-mCnc: 143 mg/dL
Hgb A1c MFr Bld: 6.6 % — ABNORMAL HIGH (ref 4.8–5.6)

## 2024-02-02 LAB — LIPID PANEL
Chol/HDL Ratio: 3.3 ratio (ref 0.0–5.0)
Cholesterol, Total: 115 mg/dL (ref 100–199)
HDL: 35 mg/dL — ABNORMAL LOW (ref >39)
LDL Chol Calc (NIH): 60 mg/dL (ref 0–99)
Triglycerides: 110 mg/dL (ref 0–149)
VLDL Cholesterol Cal: 20 mg/dL (ref 5–40)

## 2024-02-02 LAB — PSA: Prostate Specific Ag, Serum: 1.9 ng/mL (ref 0.0–4.0)

## 2024-02-02 LAB — TSH: TSH: 1.94 u[IU]/mL (ref 0.450–4.500)

## 2024-02-02 NOTE — Progress Notes (Signed)
 Informed via Mychart message

## 2024-02-09 DIAGNOSIS — R6883 Chills (without fever): Secondary | ICD-10-CM | POA: Diagnosis not present

## 2024-02-09 DIAGNOSIS — Z0130 Encounter for examination of blood pressure without abnormal findings: Secondary | ICD-10-CM | POA: Diagnosis not present

## 2024-02-09 DIAGNOSIS — R918 Other nonspecific abnormal finding of lung field: Secondary | ICD-10-CM | POA: Diagnosis not present

## 2024-02-09 DIAGNOSIS — J9601 Acute respiratory failure with hypoxia: Secondary | ICD-10-CM | POA: Diagnosis not present

## 2024-02-09 DIAGNOSIS — R0902 Hypoxemia: Secondary | ICD-10-CM | POA: Diagnosis not present

## 2024-03-31 ENCOUNTER — Other Ambulatory Visit: Payer: Self-pay

## 2024-03-31 DIAGNOSIS — E782 Mixed hyperlipidemia: Secondary | ICD-10-CM

## 2024-03-31 MED ORDER — SIMVASTATIN 20 MG PO TABS: 20.0000 mg | ORAL_TABLET | Freq: Every day | ORAL | 11 refills | Status: AC

## 2024-04-28 ENCOUNTER — Other Ambulatory Visit: Payer: Self-pay | Admitting: Cardiology

## 2024-06-01 DIAGNOSIS — Z7689 Persons encountering health services in other specified circumstances: Secondary | ICD-10-CM | POA: Diagnosis not present

## 2024-06-01 DIAGNOSIS — E782 Mixed hyperlipidemia: Secondary | ICD-10-CM | POA: Diagnosis not present

## 2024-06-01 DIAGNOSIS — Z1211 Encounter for screening for malignant neoplasm of colon: Secondary | ICD-10-CM | POA: Diagnosis not present

## 2024-06-01 DIAGNOSIS — Z6836 Body mass index (BMI) 36.0-36.9, adult: Secondary | ICD-10-CM | POA: Diagnosis not present

## 2024-06-01 DIAGNOSIS — Z800 Family history of malignant neoplasm of digestive organs: Secondary | ICD-10-CM | POA: Diagnosis not present

## 2024-06-01 DIAGNOSIS — E1165 Type 2 diabetes mellitus with hyperglycemia: Secondary | ICD-10-CM | POA: Diagnosis not present

## 2024-06-02 ENCOUNTER — Ambulatory Visit: Admitting: Cardiology

## 2024-06-10 DIAGNOSIS — Z7689 Persons encountering health services in other specified circumstances: Secondary | ICD-10-CM | POA: Diagnosis not present

## 2024-06-10 DIAGNOSIS — T148XXA Other injury of unspecified body region, initial encounter: Secondary | ICD-10-CM | POA: Diagnosis not present

## 2024-06-27 DIAGNOSIS — F411 Generalized anxiety disorder: Secondary | ICD-10-CM | POA: Diagnosis not present

## 2024-06-27 DIAGNOSIS — F200 Paranoid schizophrenia: Secondary | ICD-10-CM | POA: Diagnosis not present

## 2024-07-12 DIAGNOSIS — H2513 Age-related nuclear cataract, bilateral: Secondary | ICD-10-CM | POA: Diagnosis not present

## 2024-07-12 DIAGNOSIS — E119 Type 2 diabetes mellitus without complications: Secondary | ICD-10-CM | POA: Diagnosis not present

## 2024-07-12 DIAGNOSIS — H524 Presbyopia: Secondary | ICD-10-CM | POA: Diagnosis not present

## 2024-07-21 ENCOUNTER — Other Ambulatory Visit: Payer: Self-pay | Admitting: Cardiology

## 2024-07-27 ENCOUNTER — Telehealth: Payer: Self-pay | Admitting: Cardiology

## 2024-07-27 NOTE — Telephone Encounter (Signed)
 Lvm for pt to come get ckd (kidney urine test) done before the end of the year

## 2024-08-08 ENCOUNTER — Other Ambulatory Visit: Payer: Self-pay | Admitting: *Deleted

## 2024-08-08 DIAGNOSIS — D751 Secondary polycythemia: Secondary | ICD-10-CM

## 2024-08-09 ENCOUNTER — Inpatient Hospital Stay: Payer: Medicare PPO

## 2024-08-09 ENCOUNTER — Ambulatory Visit: Payer: Medicare PPO | Admitting: Internal Medicine

## 2024-08-09 ENCOUNTER — Inpatient Hospital Stay: Payer: Medicare PPO | Attending: Internal Medicine

## 2024-08-16 DIAGNOSIS — D128 Benign neoplasm of rectum: Secondary | ICD-10-CM | POA: Diagnosis not present

## 2024-08-16 DIAGNOSIS — D122 Benign neoplasm of ascending colon: Secondary | ICD-10-CM | POA: Diagnosis not present

## 2024-08-16 DIAGNOSIS — K641 Second degree hemorrhoids: Secondary | ICD-10-CM | POA: Diagnosis not present

## 2024-08-16 DIAGNOSIS — Z6838 Body mass index (BMI) 38.0-38.9, adult: Secondary | ICD-10-CM | POA: Diagnosis not present

## 2024-08-16 DIAGNOSIS — J9691 Respiratory failure, unspecified with hypoxia: Secondary | ICD-10-CM | POA: Diagnosis not present

## 2024-08-16 DIAGNOSIS — Z7984 Long term (current) use of oral hypoglycemic drugs: Secondary | ICD-10-CM | POA: Diagnosis not present

## 2024-08-16 DIAGNOSIS — K635 Polyp of colon: Secondary | ICD-10-CM | POA: Diagnosis not present

## 2024-08-16 DIAGNOSIS — Z1211 Encounter for screening for malignant neoplasm of colon: Secondary | ICD-10-CM | POA: Diagnosis not present

## 2024-08-16 DIAGNOSIS — Z800 Family history of malignant neoplasm of digestive organs: Secondary | ICD-10-CM | POA: Diagnosis not present

## 2024-08-16 DIAGNOSIS — E119 Type 2 diabetes mellitus without complications: Secondary | ICD-10-CM | POA: Diagnosis not present

## 2024-08-16 DIAGNOSIS — G4730 Sleep apnea, unspecified: Secondary | ICD-10-CM | POA: Diagnosis not present

## 2024-08-20 ENCOUNTER — Other Ambulatory Visit: Payer: Self-pay | Admitting: Cardiology

## 2024-08-30 DIAGNOSIS — Z87891 Personal history of nicotine dependence: Secondary | ICD-10-CM | POA: Diagnosis not present

## 2024-08-30 DIAGNOSIS — G4733 Obstructive sleep apnea (adult) (pediatric): Secondary | ICD-10-CM | POA: Diagnosis not present

## 2024-08-30 DIAGNOSIS — R0609 Other forms of dyspnea: Secondary | ICD-10-CM | POA: Diagnosis not present

## 2024-08-30 DIAGNOSIS — R0602 Shortness of breath: Secondary | ICD-10-CM | POA: Diagnosis not present

## 2024-08-30 DIAGNOSIS — Z0130 Encounter for examination of blood pressure without abnormal findings: Secondary | ICD-10-CM | POA: Diagnosis not present

## 2024-09-26 ENCOUNTER — Other Ambulatory Visit: Payer: Self-pay | Admitting: Cardiology

## 2024-10-19 ENCOUNTER — Other Ambulatory Visit: Payer: Self-pay | Admitting: Cardiology

## 2024-10-25 ENCOUNTER — Other Ambulatory Visit: Payer: Self-pay | Admitting: Cardiology

## 2024-11-24 ENCOUNTER — Other Ambulatory Visit: Payer: Self-pay | Admitting: Cardiology

## 2024-12-22 ENCOUNTER — Other Ambulatory Visit: Payer: Self-pay | Admitting: Cardiology
